# Patient Record
Sex: Female | Born: 1965 | Race: White | Hispanic: No | Marital: Married | Smoking: Never smoker
Health system: Southern US, Community
[De-identification: ages and names within clinical notes are randomized; demographics above are authoritative.]

## PROBLEM LIST (undated history)

## (undated) DIAGNOSIS — I499 Cardiac arrhythmia, unspecified: Secondary | ICD-10-CM

## (undated) DIAGNOSIS — I10 Essential (primary) hypertension: Secondary | ICD-10-CM

## (undated) DIAGNOSIS — D219 Benign neoplasm of connective and other soft tissue, unspecified: Secondary | ICD-10-CM

## (undated) DIAGNOSIS — E7800 Pure hypercholesterolemia, unspecified: Secondary | ICD-10-CM

## (undated) HISTORY — DX: Pure hypercholesterolemia, unspecified: E78.00

## (undated) HISTORY — PX: WISDOM TOOTH EXTRACTION: SHX21

---

## 2003-09-04 ENCOUNTER — Other Ambulatory Visit: Admission: RE | Admit: 2003-09-04 | Discharge: 2003-09-04 | Payer: Self-pay | Admitting: Obstetrics and Gynecology

## 2003-09-20 ENCOUNTER — Encounter: Admission: RE | Admit: 2003-09-20 | Discharge: 2003-09-20 | Payer: Self-pay | Admitting: Obstetrics and Gynecology

## 2004-11-20 ENCOUNTER — Encounter: Admission: RE | Admit: 2004-11-20 | Discharge: 2004-11-20 | Payer: Self-pay | Admitting: Obstetrics and Gynecology

## 2004-12-03 ENCOUNTER — Encounter: Admission: RE | Admit: 2004-12-03 | Discharge: 2004-12-03 | Payer: Self-pay | Admitting: Obstetrics and Gynecology

## 2005-03-22 ENCOUNTER — Other Ambulatory Visit: Admission: RE | Admit: 2005-03-22 | Discharge: 2005-03-22 | Payer: Self-pay | Admitting: Obstetrics & Gynecology

## 2005-12-15 ENCOUNTER — Encounter: Admission: RE | Admit: 2005-12-15 | Discharge: 2005-12-15 | Payer: Self-pay | Admitting: Obstetrics & Gynecology

## 2007-01-27 ENCOUNTER — Encounter: Admission: RE | Admit: 2007-01-27 | Discharge: 2007-01-27 | Payer: Self-pay | Admitting: Obstetrics & Gynecology

## 2008-08-21 ENCOUNTER — Encounter: Admission: RE | Admit: 2008-08-21 | Discharge: 2008-08-21 | Payer: Self-pay | Admitting: Obstetrics & Gynecology

## 2009-12-03 ENCOUNTER — Encounter: Admission: RE | Admit: 2009-12-03 | Discharge: 2009-12-03 | Payer: Self-pay | Admitting: Obstetrics & Gynecology

## 2011-07-06 ENCOUNTER — Other Ambulatory Visit: Payer: Self-pay | Admitting: Obstetrics & Gynecology

## 2011-07-06 DIAGNOSIS — Z1231 Encounter for screening mammogram for malignant neoplasm of breast: Secondary | ICD-10-CM

## 2011-07-13 ENCOUNTER — Ambulatory Visit
Admission: RE | Admit: 2011-07-13 | Discharge: 2011-07-13 | Disposition: A | Discharge status: Home / Self Care | Payer: BC Managed Care – PPO | Source: Ambulatory Visit | Attending: Obstetrics & Gynecology | Admitting: Obstetrics & Gynecology

## 2011-07-13 DIAGNOSIS — Z1231 Encounter for screening mammogram for malignant neoplasm of breast: Secondary | ICD-10-CM

## 2011-12-05 ENCOUNTER — Encounter (HOSPITAL_COMMUNITY): Payer: Self-pay | Admitting: *Deleted

## 2011-12-05 ENCOUNTER — Observation Stay (HOSPITAL_COMMUNITY)
Admission: AD | Admit: 2011-12-05 | Discharge: 2011-12-05 | Disposition: A | Discharge status: Home / Self Care | Payer: BC Managed Care – PPO | Source: Ambulatory Visit | Attending: Obstetrics | Admitting: Obstetrics

## 2011-12-05 DIAGNOSIS — D649 Anemia, unspecified: Principal | ICD-10-CM | POA: Insufficient documentation

## 2011-12-05 DIAGNOSIS — N920 Excessive and frequent menstruation with regular cycle: Secondary | ICD-10-CM | POA: Insufficient documentation

## 2011-12-05 DIAGNOSIS — D259 Leiomyoma of uterus, unspecified: Secondary | ICD-10-CM | POA: Insufficient documentation

## 2011-12-05 DIAGNOSIS — R0602 Shortness of breath: Secondary | ICD-10-CM | POA: Insufficient documentation

## 2011-12-05 HISTORY — DX: Benign neoplasm of connective and other soft tissue, unspecified: D21.9

## 2011-12-05 LAB — SAMPLE TO BLOOD BANK

## 2011-12-05 LAB — COMPREHENSIVE METABOLIC PANEL
ALT: 10 U/L (ref 0–35)
AST: 15 U/L (ref 0–37)
Albumin: 3.2 g/dL — ABNORMAL LOW (ref 3.5–5.2)
Alkaline Phosphatase: 60 U/L (ref 39–117)
BUN: 11 mg/dL (ref 6–23)
CO2: 21 mEq/L (ref 19–32)
Calcium: 8.6 mg/dL (ref 8.4–10.5)
Chloride: 101 mEq/L (ref 96–112)
Creatinine, Ser: 0.74 mg/dL (ref 0.50–1.10)
GFR calc Af Amer: >90 mL/min (ref >90)
GFR calc non Af Amer: >90 mL/min (ref >90)
Glucose, Bld: 98 mg/dL (ref 70–99)
Potassium: 4.4 mEq/L (ref 3.5–5.1)
Sodium: 135 mEq/L (ref 135–145)
Total Bilirubin: 0.1 mg/dL — ABNORMAL LOW (ref 0.3–1.2)
Total Protein: 6.1 g/dL (ref 6.0–8.3)

## 2011-12-05 LAB — CBC WITH DIFFERENTIAL/PLATELET
Basophils Absolute: 0 10*3/uL (ref 0.0–0.1)
Basophils Relative: 0 % (ref 0–1)
Eosinophils Absolute: 0.1 10*3/uL (ref 0.0–0.7)
Eosinophils Relative: 2 % (ref 0–5)
HCT: 22.4 % — ABNORMAL LOW (ref 36.0–46.0)
Hemoglobin: 6.5 g/dL — CL (ref 12.0–15.0)
Lymphocytes Relative: 15 % (ref 12–46)
Lymphs Abs: 1.1 10*3/uL (ref 0.7–4.0)
MCH: 21.6 pg — ABNORMAL LOW (ref 26.0–34.0)
MCHC: 29 g/dL — ABNORMAL LOW (ref 30.0–36.0)
MCV: 74.4 fL — ABNORMAL LOW (ref 78.0–100.0)
Monocytes Absolute: 0.6 10*3/uL (ref 0.1–1.0)
Monocytes Relative: 9 % (ref 3–12)
Neutro Abs: 5.2 10*3/uL (ref 1.7–7.7)
Neutrophils Relative %: 74 % (ref 43–77)
Platelets: 272 10*3/uL (ref 150–400)
RBC: 3.01 MIL/uL — ABNORMAL LOW (ref 3.87–5.11)
RDW: 17 % — ABNORMAL HIGH (ref 11.5–15.5)
WBC: 7 10*3/uL (ref 4.0–10.5)

## 2011-12-05 LAB — ABO/RH: ABO/RH(D): O POS

## 2011-12-05 LAB — PREPARE RBC (CROSSMATCH)

## 2011-12-05 MED ORDER — DIPHENHYDRAMINE HCL 25 MG PO CAPS
25.0000 mg | ORAL_CAPSULE | Freq: Once | ORAL | Status: AC
  Administered 2011-12-05: 25 mg via ORAL
  Filled 2011-12-05: qty 1

## 2011-12-05 MED ORDER — ACETAMINOPHEN 325 MG PO TABS
650.0000 mg | ORAL_TABLET | Freq: Once | ORAL | Status: AC
  Administered 2011-12-05: 650 mg via ORAL
  Filled 2011-12-05: qty 2

## 2011-12-05 MED ORDER — FUROSEMIDE 10 MG/ML IJ SOLN
20.0000 mg | Freq: Once | INTRAMUSCULAR | Status: AC
  Administered 2011-12-05: 20 mg via INTRAVENOUS
  Filled 2011-12-05: qty 2

## 2011-12-05 NOTE — Progress Notes (Signed)
CRITICAL VALUE ALERT  Critical value received:  HGB 6.5  Date of notification:  12/05/11  Time of notification: 1510  Critical value read back:yes  Nurse who received alert:  K.Devonna Oboyle,RN  MD notified (1st page):  Dr. Ernestina Penna on Unit  Time of first page:    MD notified (2nd page):  Time of second page:  Responding MD:   Time MD responded:

## 2011-12-05 NOTE — MAU Note (Signed)
Pt states hx of anemia r/t uterine fibroids. Has been bleeding x40 days. Is taking birth control tablets and was given depo provera to decrease bleeding and today bleeding is very light. Pt has had hx of labored breathing while walking up stairs, however today has had labored breathing with any activity. When sitting s/s go away.

## 2011-12-05 NOTE — H&P (Addendum)
Chief complaint: Shortness of breath  History of present illness: 46 year old nonpregnant patient with known fibroids, menorrhagia and anemia presents with shortness of breath this a.m. Patient was seen in the office this past week for the first time for complaints of menorrhagia. Patient was started on Depo-Provera and oral contraceptive pills and notes through the week her bleeding has slowed and over the past 24 hours she notes only scant vaginal spotting. No dysmenorrhea. Patient states no chest pain, no fevers. At rest the patient has no shortness of breath and no dizziness, however when active patient notes some mild dizziness and shortness of breath. Both of these resolved with resting. Patient was concerned about her hemoglobin level and presented for evaluation. Patient notes no neck pain, no arm pain no chest pain, no radiating pain of any kind.  Past medical history: Patient denies hypertension, denies hypercholesterolemia, denies any cardiovascular or pulmonary medical problems.  Medications: Depo-Provera, oral contraceptives, iron No known drug allergies Social history: Married, husband with vasectomy  Physical exam:  Filed Vitals:   12/05/11 1421  BP: 150/74  Pulse: 100  Temp: 97.9 F (36.6 C)  TempSrc: Oral  Resp: 20  Height: 5\' 7"  (1.702 m)  Weight: 103.477 kg (228 lb 2 oz)  SpO2: 100%    General: Well-appearing, in no distress, obese Cardiovascular: Regular rate and rhythm, no murmur rub or gallop Pulmonary: Clear to auscultation bilaterally, no wheezes Back: No CVA tenderness Abdomen: Obese, pelvic mass consistent with fibroid, nontender, no rebound, no guarding, no distention GU: Deferred Lower extremities: Nontender, no edema  CBC    Component Value Date/Time   WBC 7.0 12/05/2011 1448   RBC 3.01* 12/05/2011 1448   HGB 6.5* 12/05/2011 1448   HCT 22.4* 12/05/2011 1448   PLT 272 12/05/2011 1448   MCV 74.4* 12/05/2011 1448   MCH 21.6* 12/05/2011 1448   MCHC 29.0*  12/05/2011 1448   RDW 17.0* 12/05/2011 1448   LYMPHSABS PENDING 12/05/2011 1448   MONOABS PENDING 12/05/2011 1448   EOSABS PENDING 12/05/2011 1448   BASOSABS PENDING 12/05/2011 1448     Assessment and plan: 46 year old G2 P0 with complaint of shortness of breath only on exertion, most likely secondary to symptomatic anemia  Anemia. No current bleeding, chronic anemia from vaginal bleeding. Bleeding is controlled hormonally at this time and patient is outpatient followup for this issue. Given her symptoms will plan transfusion 2 units packed red blood cells. Transfusion reaction, HIV and hepatitis C risks discussed with patient. Also discussed pulmonary edema risk and will plan IV Lasix between units. Patient in agreement.   Stacie Nunez A. 12/05/2011 3:30 PM   Nl EKG  Pulmonary embolus remains in differential diagnosis. Patient is obese and on oral contraceptives. Patient with normal vitals, normal O2 sat, no lower extremity edema no wheezing. Will continue to consider this diagnosis however clinically anemia is the most likely cause for her symptoms.

## 2011-12-06 NOTE — Discharge Summary (Signed)
  Physician Discharge Summary  Patient ID: Stacie Nunez MRN: [id number] DOB/AGE: [birth year] 46 y.o.  Admit date: 12/05/2011 Discharge date: 12/06/2011  Admission Diagnoses: LABORED BREATHING, ANEMIC  Discharge Diagnoses: LABORED BREATHING, ANEMIC        Active Problems:  * No active hospital problems. *    Discharged Condition: good  Hospital Course: Improved post blood transfusion  Consults: None  Treatments: Packed red blood cell transfusion  Disposition: 01-Home or Self Care     Medication List     As of 12/06/2011  1:00 AM    ASK your doctor about these medications         B-12 PO   Take 1 tablet by mouth daily. OTC b-12 supplement, pt does not know strength      calcium-vitamin D 500-200 MG-UNIT per tablet   Commonly known as: OSCAL WITH D   Take 1 tablet by mouth daily.      ferrous sulfate 325 (65 FE) MG tablet   Take 325 mg by mouth daily.      ibuprofen 200 MG tablet   Commonly known as: ADVIL,MOTRIN   Take 600 mg by mouth every 6 (six) hours as needed. pain      medroxyPROGESTERone 150 MG/ML injection   Commonly known as: DEPO-PROVERA   Inject 150 mg into the muscle every 3 (three) months.      norethindrone-ethinyl estradiol-iron 1.5-30 MG-MCG tablet   Commonly known as: MICROGESTIN FE,GILDESS FE,LOESTRIN FE   Take 1 tablet by mouth daily.         SignedGenia Del, MD 12/06/2011, 1:00 AM

## 2011-12-07 LAB — TYPE AND SCREEN
ABO/RH(D): O POS
Antibody Screen: NEGATIVE
Unit division: 0
Unit division: 0

## 2011-12-15 NOTE — Progress Notes (Signed)
Post discharge review completed for dates of service of 12/05/11.

## 2012-01-25 ENCOUNTER — Ambulatory Visit (INDEPENDENT_AMBULATORY_CARE_PROVIDER_SITE_OTHER): Payer: BC Managed Care – PPO | Admitting: Family Medicine

## 2012-01-25 VITALS — BP 163/100 | HR 99 | Temp 98.2°F | Resp 16 | Ht 67.0 in | Wt 226.0 lb

## 2012-01-25 DIAGNOSIS — IMO0001 Reserved for inherently not codable concepts without codable children: Secondary | ICD-10-CM

## 2012-01-25 DIAGNOSIS — R030 Elevated blood-pressure reading, without diagnosis of hypertension: Secondary | ICD-10-CM

## 2012-01-25 DIAGNOSIS — D649 Anemia, unspecified: Secondary | ICD-10-CM

## 2012-01-25 LAB — BASIC METABOLIC PANEL
Calcium: 9.2 mg/dL (ref 8.4–10.5)
Potassium: 4.2 mEq/L (ref 3.5–5.3)
Sodium: 139 mEq/L (ref 135–145)

## 2012-01-25 LAB — BASIC METABOLIC PANEL WITH GFR
BUN: 6 mg/dL (ref 6–23)
CO2: 21 meq/L (ref 19–32)
Chloride: 107 meq/L (ref 96–112)
Creat: 0.81 mg/dL (ref 0.50–1.10)
Glucose, Bld: 87 mg/dL (ref 70–99)

## 2012-01-25 MED ORDER — HYDROCHLOROTHIAZIDE 25 MG PO TABS: 25.0000 mg | ORAL_TABLET | Freq: Every day | ORAL | Status: DC

## 2012-01-25 NOTE — Progress Notes (Signed)
[facility]:  Office Visit  Chief Complaint:  Chief Complaint  Patient presents with  . Hypertension    HPI: Stacie Nunez is a 46 y.o. female who complains of HTN. BP at Ob/Gyn was high in the 150-170s over 90s-100. She does not have CP, palpitations, nausea, vomiting, HA, vision changes. No h/o  MI or CVA sxs. Was put on low dose OCP for fibroids, then started bleeding and has been bledding for 92 days. She then got depo and then also OCP and Lupron injections. Now only on Lupron. Labs done at [facility] : CBC yesterday at 12.3. She has not taken any new meds excep what has been rx by Ob, caffeine intake is none, alcohol intake is rare. No prior h.o HTN until on meds for fibroids. Goal is to stop bleeding and possibly do hysterectomy.      Past Medical History  Diagnosis Date  . Fibroids    Past Surgical History  Procedure Date  . Wisdom tooth extraction    History   Social History  . Marital Status: Married    Spouse Name: N/A    Number of Children: N/A  . Years of Education: N/A   Social History Main Topics  . Smoking status: Never Smoker   . Smokeless tobacco: None  . Alcohol Use: No  . Drug Use: No  . Sexually Active: Yes   Other Topics Concern  . None   Social History Narrative  . None   Family History  Problem Relation Age of Onset  . Hypertension Mother   . Hypertension Father   . Cancer Maternal Grandmother   . Diabetes Paternal Grandmother    No Known Allergies Prior to Admission medications   Medication Sig Start Date End Date Taking? Authorizing Provider  calcium-vitamin D (OSCAL WITH D) 500-200 MG-UNIT per tablet Take 1 tablet by mouth daily.   Yes Historical Provider, MD  Cyanocobalamin (B-12 PO) Take 1 tablet by mouth daily. OTC b-12 supplement, pt does not know strength   Yes Historical Provider, MD  ferrous sulfate 325 (65 FE) MG tablet Take 325 mg by mouth daily.   Yes Historical Provider, MD  ibuprofen  (ADVIL,MOTRIN) 200 MG tablet Take 600 mg by mouth every 6 (six) hours as needed. pain   Yes Historical Provider, MD  medroxyPROGESTERone (DEPO-PROVERA) 150 MG/ML injection Inject 150 mg into the muscle every 3 (three) months.   Yes Historical Provider, MD  norethindrone-ethinyl estradiol-iron (MICROGESTIN FE,GILDESS FE,LOESTRIN FE) 1.5-30 MG-MCG tablet Take 1 tablet by mouth daily.   Yes Historical Provider, MD     ROS: The patient denies fevers, chills, night sweats, unintentional weight loss, chest pain, palpitations, wheezing, dyspnea on exertion, nausea, vomiting, abdominal pain, dysuria, hematuria, melena, numbness, weakness, or tingling.   All other systems have been reviewed and were otherwise negative with the exception of those mentioned in the HPI and as above.    PHYSICAL EXAM: Filed Vitals:   01/25/12 0939  BP: 163/100  Pulse: 99  Temp: 98.2 F (36.8 C)  Resp: 16   Filed Vitals:   01/25/12 0939  Height: 5\' 7"  (1.702 m)  Weight: 226 lb (102.513 kg)   Body mass index is 35.40 kg/(m^2).  General: Alert, no acute distress HEENT:  Normocephalic, atraumatic, oropharynx patent. EOMI, PERRLA, fundoscopic exam nl. Cardiovascular:  Regular rate and rhythm, no rubs murmurs or gallops.  No Carotid bruits, radial pulse intact. No pedal edema.  Respiratory: Clear to auscultation bilaterally.  No  wheezes, rales, or rhonchi.  No cyanosis, no use of accessory musculature GI: No organomegaly, abdomen is soft and non-tender, positive bowel sounds.  No masses. Skin: No rashes. Neurologic: Facial musculature symmetric. Psychiatric: Patient is appropriate throughout our interaction. Lymphatic: No cervical lymphadenopathy Musculoskeletal: Gait intact.   LABS: Results for orders placed during the hospital encounter of 12/05/11  CBC WITH DIFFERENTIAL      Component Value Range   WBC 7.0  4.0 - 10.5 K/uL   RBC 3.01 (*) 3.87 - 5.11 MIL/uL   Hemoglobin 6.5 (*) 12.0 - 15.0 g/dL   HCT 16.1  (*) 09.6 - 46.0 %   MCV 74.4 (*) 78.0 - 100.0 fL   MCH 21.6 (*) 26.0 - 34.0 pg   MCHC 29.0 (*) 30.0 - 36.0 g/dL   RDW 04.5 (*) 40.9 - 81.1 %   Platelets 272  150 - 400 K/uL   Neutrophils Relative 74  43 - 77 %   Lymphocytes Relative 15  12 - 46 %   Monocytes Relative 9  3 - 12 %   Eosinophils Relative 2  0 - 5 %   Basophils Relative 0  0 - 1 %   Neutro Abs 5.2  1.7 - 7.7 K/uL   Lymphs Abs 1.1  0.7 - 4.0 K/uL   Monocytes Absolute 0.6  0.1 - 1.0 K/uL   Eosinophils Absolute 0.1  0.0 - 0.7 K/uL   Basophils Absolute 0.0  0.0 - 0.1 K/uL   RBC Morphology POLYCHROMASIA PRESENT    COMPREHENSIVE METABOLIC PANEL      Component Value Range   Sodium 135  135 - 145 mEq/L   Potassium 4.4  3.5 - 5.1 mEq/L   Chloride 101  96 - 112 mEq/L   CO2 21  19 - 32 mEq/L   Glucose, Bld 98  70 - 99 mg/dL   BUN 11  6 - 23 mg/dL   Creatinine, Ser 9.14  0.50 - 1.10 mg/dL   Calcium 8.6  8.4 - 78.2 mg/dL   Total Protein 6.1  6.0 - 8.3 g/dL   Albumin 3.2 (*) 3.5 - 5.2 g/dL   AST 15  0 - 37 U/L   ALT 10  0 - 35 U/L   Alkaline Phosphatase 60  39 - 117 U/L   Total Bilirubin 0.1 (*) 0.3 - 1.2 mg/dL   GFR calc non Af Amer >90  >90 mL/min   GFR calc Af Amer >90  >90 mL/min  PREPARE RBC (CROSSMATCH)      Component Value Range   Order Confirmation ORDER PROCESSED BY BLOOD BANK    TYPE AND SCREEN      Component Value Range   ABO/RH(D) O POS     Antibody Screen NEG     Sample Expiration 12/08/2011     Unit Number [id number]     Blood Component Type RED CELLS,LR     Unit division 00     Status of Unit ISSUED,FINAL     Transfusion Status OK TO TRANSFUSE     Crossmatch Result Compatible     Unit Number [id number]     Blood Component Type RED CELLS,LR     Unit division 00     Status of Unit ISSUED,FINAL     Transfusion Status OK TO TRANSFUSE     Crossmatch Result Compatible    SAMPLE TO BLOOD BANK      Component Value Range   Blood Bank Specimen SAMPLE AVAILABLE FOR TESTING  Sample Expiration  12/08/2011    ABO/RH      Component Value Range   ABO/RH(D) O POS       EKG/XRAY:   Primary read interpreted by Dr. Conley Rolls at [facility].   ASSESSMENT/PLAN: Encounter Diagnoses  Name Primary?  . Elevated blood pressure Yes  . Anemia    I will not dx with HTN since this may just be a SE of all the recent hormone medications she has been on for her fibroids and bleeding. We will just try to manage BP until all that is stabilized and she can get a hysterectomy if that is the final outcome. Went through BP med options with her.  Will try HCTZ 25mg  daily BP goal is < 140/90, HR < 100 Check BP BID after meds and then before bedtime, if not at goal then call in 3 days. Recheck BP logs  in 1 week in office.    LE, THAO PHUONG, DO 01/25/2012 10:16 AM

## 2012-01-28 ENCOUNTER — Telehealth: Payer: Self-pay | Admitting: Family Medicine

## 2012-01-28 NOTE — Telephone Encounter (Signed)
Spoke to patient about labs, she has had some tingling in her legs but not consistent. BP is lower but diastolic is still higher than 90. Wants to try another couple of days on HCTZ and get BP logs to me before adding another BP med. Advise to eat a banana a day

## 2012-02-02 DIAGNOSIS — I499 Cardiac arrhythmia, unspecified: Secondary | ICD-10-CM

## 2012-02-02 DIAGNOSIS — I10 Essential (primary) hypertension: Secondary | ICD-10-CM

## 2012-02-02 HISTORY — DX: Cardiac arrhythmia, unspecified: I49.9

## 2012-02-02 HISTORY — DX: Essential (primary) hypertension: I10

## 2012-02-08 ENCOUNTER — Other Ambulatory Visit: Payer: Self-pay | Admitting: Family Medicine

## 2012-02-08 DIAGNOSIS — IMO0001 Reserved for inherently not codable concepts without codable children: Secondary | ICD-10-CM

## 2012-02-08 MED ORDER — HYDROCHLOROTHIAZIDE 25 MG PO TABS: 25.0000 mg | ORAL_TABLET | Freq: Every day | ORAL | Status: DC

## 2012-02-15 LAB — BASIC METABOLIC PANEL
Glucose: 93 mg/dL
Potassium: 4.7 mmol/L (ref 3.4–5.3)

## 2012-02-15 LAB — CBC AND DIFFERENTIAL
HCT: 28 % — AB (ref 36–46)
Hemoglobin: 9.3 g/dL — AB (ref 12.0–16.0)
Platelets: 466 10*3/uL — AB (ref 150–399)
WBC: 5.3 10^3/mL

## 2012-02-16 ENCOUNTER — Ambulatory Visit (INDEPENDENT_AMBULATORY_CARE_PROVIDER_SITE_OTHER): Payer: BC Managed Care – PPO | Admitting: Family Medicine

## 2012-02-16 VITALS — BP 130/84 | HR 105 | Temp 98.1°F | Resp 16 | Ht 67.0 in | Wt 215.8 lb

## 2012-02-16 DIAGNOSIS — R000 Tachycardia, unspecified: Secondary | ICD-10-CM

## 2012-02-16 DIAGNOSIS — I10 Essential (primary) hypertension: Secondary | ICD-10-CM

## 2012-02-16 LAB — HEPATIC FUNCTION PANEL
ALT: 18 U/L (ref 7–35)
AST: 17 U/L (ref 13–35)
Bilirubin, Total: 0.1 mg/dL

## 2012-02-16 NOTE — Progress Notes (Signed)
[facility]:  Office Visit  Chief Complaint:  Chief Complaint  Patient presents with  . Arm Pain    left  . Follow-up    blood work- hemoglobin  . Leg Pain    left    HPI: Stacie Nunez is a 47 y.o. female who complains of  Elevated BP. She is here for recheck. Since being on HCTZ her BP has been in range for the most part < 140/90   She has been on HCTZ, she will have twinges in her body. Heart races on occasion. Left sided arm pain , twinges x 1 day, lasting several hours. Recently Had some active yoga practice this weekend. Just on Lupron since Dec 6 with the hopes that it will shrink her fibroids. Prior to this she was on OCP and Angestin and was bleeding heavily. Tomorrow has [place] to make sure she has no polyps. Also on Percocet for fibroid pain. The goal is that if she does not have polypsm her bleeding is unter control then her OB/Gyn will put in mirena and then go into menopause to control fibroid sxs. IF she has polyps then she is considering surgery. Again she is here for BP check only from out office, BP has been elevated since she was put on hormone meds.   Past Medical History  Diagnosis Date  . Fibroids    Past Surgical History  Procedure Date  . Wisdom tooth extraction    History   Social History  . Marital Status: Married    Spouse Name: N/A    Number of Children: N/A  . Years of Education: N/A   Social History Main Topics  . Smoking status: Never Smoker   . Smokeless tobacco: None  . Alcohol Use: No  . Drug Use: No  . Sexually Active: Yes   Other Topics Concern  . None   Social History Narrative  . None   Family History  Problem Relation Age of Onset  . Hypertension Mother   . Hypertension Father   . Cancer Maternal Grandmother   . Diabetes Paternal Grandmother    No Known Allergies Prior to Admission medications   Medication Sig Start Date End Date Taking? Authorizing Provider  ferrous sulfate 325 (65 FE) MG tablet Take  325 mg by mouth daily.   Yes Historical Provider, MD  hydrochlorothiazide (HYDRODIURIL) 25 MG tablet Take 1 tablet (25 mg total) by mouth daily. 02/08/12  Yes Thao P Le, DO  ibuprofen (ADVIL,MOTRIN) 200 MG tablet Take 600 mg by mouth every 6 (six) hours as needed. pain   Yes Historical Provider, MD  medroxyPROGESTERone (DEPO-PROVERA) 150 MG/ML injection Inject 150 mg into the muscle every 3 (three) months.   Yes Historical Provider, MD  calcium-vitamin D (OSCAL WITH D) 500-200 MG-UNIT per tablet Take 1 tablet by mouth daily.    Historical Provider, MD  Cyanocobalamin (B-12 PO) Take 1 tablet by mouth daily. OTC b-12 supplement, pt does not know strength    Historical Provider, MD  norethindrone-ethinyl estradiol-iron (MICROGESTIN FE,GILDESS FE,LOESTRIN FE) 1.5-30 MG-MCG tablet Take 1 tablet by mouth daily.    Historical Provider, MD     ROS: The patient denies fevers, chills, night sweats, unintentional weight loss,wheezing, dyspnea on exertion, nausea, vomiting, abdominal pain, dysuria, hematuria, melena, numbness, weakness, or tingling.  All other systems have been reviewed and were otherwise negative with the exception of those mentioned in the HPI and as above.    PHYSICAL EXAM: Filed Vitals:  02/16/12 1253  BP: 130/84  Pulse: 105  Temp: 98.1 F (36.7 C)  Resp: 16   Filed Vitals:   02/16/12 1253  Height: 5\' 7"  (1.702 m)  Weight: 215 lb 12.8 oz (97.886 kg)   Body mass index is 33.80 kg/(m^2).  General: Alert, no acute distress HEENT:  Normocephalic, atraumatic, oropharynx patent.  Cardiovascular:  Regular rhythm, no rubs murmurs or gallops.  No Carotid bruits, radial pulse intact. No pedal edema.  Respiratory: Clear to auscultation bilaterally.  No wheezes, rales, or rhonchi.  No cyanosis, no use of accessory musculature GI: No organomegaly, abdomen is soft and non-tender, positive bowel sounds.  No masses. Skin: No rashes. Neurologic: Facial musculature symmetric. Psychiatric:  Patient is appropriate throughout our interaction. Lymphatic: No cervical lymphadenopathy Musculoskeletal: Gait intact.   LABS: Results for orders placed in visit on 01/25/12  BASIC METABOLIC PANEL      Component Value Range   Sodium 139  135 - 145 mEq/L   Potassium 4.2  3.5 - 5.3 mEq/L   Chloride 107  96 - 112 mEq/L   CO2 21  19 - 32 mEq/L   Glucose, Bld 87  70 - 99 mg/dL   BUN 6  6 - 23 mg/dL   Creat 0.96  0.45 - 4.09 mg/dL   Calcium 9.2  8.4 - 81.1 mg/dL     EKG/XRAY:   Primary read interpreted by Dr. Conley Rolls at [facility]. EKG SR 92 BPM no ST-T wave changes   ASSESSMENT/PLAN: Encounter Diagnoses  Name Primary?  . Tachycardia Yes  . HTN (hypertension)    F/u in 3 months or prn Repeat BMP on 02/15/11 was normal: Potassium 4.7, Sodium 139, Chloride 92, Creatinine 0.82.  She will continue her BP and pulse. We discussed other options for BP control and pulse control but she would prefer to stay with this medication. She does not have any other sxs suggestive of CAD/MI/ischemia, EKG was NSR. She has had significant bleeding due to fibroids and also lupron. This is her 115 day of vaginal bleeding. She went from 11 to 9 in her Hgb within 1-2 weeks.    LE, THAO PHUONG, DO 02/16/2012 1:52 PM

## 2012-04-28 ENCOUNTER — Encounter: Payer: Self-pay | Admitting: *Deleted

## 2012-05-16 ENCOUNTER — Encounter: Payer: Self-pay | Admitting: Family Medicine

## 2012-05-16 DIAGNOSIS — I10 Essential (primary) hypertension: Secondary | ICD-10-CM

## 2012-06-12 ENCOUNTER — Other Ambulatory Visit: Payer: Self-pay | Admitting: Family Medicine

## 2012-06-20 ENCOUNTER — Other Ambulatory Visit: Payer: Self-pay

## 2012-06-20 DIAGNOSIS — Z1231 Encounter for screening mammogram for malignant neoplasm of breast: Secondary | ICD-10-CM

## 2012-07-18 ENCOUNTER — Ambulatory Visit
Admission: RE | Admit: 2012-07-18 | Discharge: 2012-07-18 | Disposition: A | Discharge status: Home / Self Care | Payer: BC Managed Care – PPO | Source: Ambulatory Visit

## 2012-07-18 DIAGNOSIS — Z1231 Encounter for screening mammogram for malignant neoplasm of breast: Secondary | ICD-10-CM

## 2013-05-15 ENCOUNTER — Other Ambulatory Visit: Payer: Self-pay | Admitting: Obstetrics & Gynecology

## 2013-05-16 ENCOUNTER — Encounter (HOSPITAL_COMMUNITY): Payer: Self-pay | Admitting: *Deleted

## 2013-05-17 ENCOUNTER — Encounter (HOSPITAL_COMMUNITY): Payer: Self-pay | Admitting: Pharmacist

## 2013-05-22 ENCOUNTER — Encounter (HOSPITAL_COMMUNITY): Payer: BC Managed Care – PPO | Admitting: Certified Registered Nurse Anesthetist

## 2013-05-22 ENCOUNTER — Encounter (HOSPITAL_COMMUNITY)
Admission: RE | Disposition: A | Discharge status: Home / Self Care | Payer: Self-pay | Source: Ambulatory Visit | Attending: Obstetrics & Gynecology

## 2013-05-22 ENCOUNTER — Ambulatory Visit (HOSPITAL_COMMUNITY): Payer: BC Managed Care – PPO | Admitting: Certified Registered Nurse Anesthetist

## 2013-05-22 ENCOUNTER — Ambulatory Visit (HOSPITAL_COMMUNITY)
Admission: RE | Admit: 2013-05-22 | Discharge: 2013-05-22 | Disposition: A | Discharge status: Home / Self Care | Payer: BC Managed Care – PPO | Source: Ambulatory Visit | Attending: Obstetrics & Gynecology | Admitting: Obstetrics & Gynecology

## 2013-05-22 ENCOUNTER — Encounter (HOSPITAL_COMMUNITY): Payer: Self-pay | Admitting: *Deleted

## 2013-05-22 DIAGNOSIS — D251 Intramural leiomyoma of uterus: Secondary | ICD-10-CM | POA: Insufficient documentation

## 2013-05-22 DIAGNOSIS — Z8249 Family history of ischemic heart disease and other diseases of the circulatory system: Secondary | ICD-10-CM | POA: Insufficient documentation

## 2013-05-22 DIAGNOSIS — D1739 Benign lipomatous neoplasm of skin and subcutaneous tissue of other sites: Secondary | ICD-10-CM | POA: Insufficient documentation

## 2013-05-22 DIAGNOSIS — N921 Excessive and frequent menstruation with irregular cycle: Secondary | ICD-10-CM | POA: Insufficient documentation

## 2013-05-22 DIAGNOSIS — I10 Essential (primary) hypertension: Secondary | ICD-10-CM | POA: Insufficient documentation

## 2013-05-22 DIAGNOSIS — I499 Cardiac arrhythmia, unspecified: Secondary | ICD-10-CM | POA: Insufficient documentation

## 2013-05-22 HISTORY — PX: MYOMECTOMY: SHX85

## 2013-05-22 HISTORY — PX: LIPOMA EXCISION: SHX5283

## 2013-05-22 HISTORY — DX: Essential (primary) hypertension: I10

## 2013-05-22 HISTORY — DX: Cardiac arrhythmia, unspecified: I49.9

## 2013-05-22 HISTORY — PX: DILATION AND CURETTAGE OF UTERUS: SHX78

## 2013-05-22 LAB — CBC
HEMATOCRIT: 37.2 % (ref 36.0–46.0)
Hemoglobin: 11.7 g/dL — ABNORMAL LOW (ref 12.0–15.0)
MCH: 28.7 pg (ref 26.0–34.0)
MCHC: 31.5 g/dL (ref 30.0–36.0)
MCV: 91.2 fL (ref 78.0–100.0)
Platelets: 303 10*3/uL (ref 150–400)
RBC: 4.08 MIL/uL (ref 3.87–5.11)
RDW: 14.2 % (ref 11.5–15.5)
WBC: 5.2 10*3/uL (ref 4.0–10.5)

## 2013-05-22 LAB — PREGNANCY, URINE: Preg Test, Ur: NEGATIVE

## 2013-05-22 LAB — TYPE AND SCREEN
ABO/RH(D): O POS
ANTIBODY SCREEN: NEGATIVE

## 2013-05-22 SURGERY — MYOMECTOMY, UTERUS, VAGINAL APPROACH
Anesthesia: General | Site: Vagina | Laterality: Right

## 2013-05-22 MED ORDER — LIDOCAINE HCL (CARDIAC) 20 MG/ML IV SOLN
INTRAVENOUS | Status: DC | PRN
  Administered 2013-05-22: 60 mg via INTRAVENOUS

## 2013-05-22 MED ORDER — KETOROLAC TROMETHAMINE 30 MG/ML IJ SOLN
INTRAMUSCULAR | Status: AC
  Filled 2013-05-22: qty 1

## 2013-05-22 MED ORDER — CHLOROPROCAINE HCL 1 % IJ SOLN
INTRAMUSCULAR | Status: AC
  Filled 2013-05-22: qty 30

## 2013-05-22 MED ORDER — LIDOCAINE HCL (CARDIAC) 20 MG/ML IV SOLN
INTRAVENOUS | Status: AC
  Filled 2013-05-22: qty 5

## 2013-05-22 MED ORDER — KETOROLAC TROMETHAMINE 30 MG/ML IJ SOLN: 15.0000 mg | Freq: Once | INTRAMUSCULAR | Status: DC | PRN

## 2013-05-22 MED ORDER — SCOPOLAMINE 1 MG/3DAYS TD PT72
1.0000 | MEDICATED_PATCH | TRANSDERMAL | Status: DC
  Administered 2013-05-22: 1.5 mg via TRANSDERMAL
  Administered 2013-05-22: 1 via TRANSDERMAL

## 2013-05-22 MED ORDER — ONDANSETRON HCL 4 MG/2ML IJ SOLN
INTRAMUSCULAR | Status: AC
  Filled 2013-05-22: qty 2

## 2013-05-22 MED ORDER — MIDAZOLAM HCL 2 MG/2ML IJ SOLN
INTRAMUSCULAR | Status: AC
  Filled 2013-05-22: qty 2

## 2013-05-22 MED ORDER — MIDAZOLAM HCL 2 MG/2ML IJ SOLN
INTRAMUSCULAR | Status: DC | PRN
  Administered 2013-05-22: 2 mg via INTRAVENOUS

## 2013-05-22 MED ORDER — GLYCOPYRROLATE 0.2 MG/ML IJ SOLN
INTRAMUSCULAR | Status: AC
  Filled 2013-05-22: qty 1

## 2013-05-22 MED ORDER — DEXAMETHASONE SODIUM PHOSPHATE 10 MG/ML IJ SOLN
INTRAMUSCULAR | Status: AC
  Filled 2013-05-22: qty 1

## 2013-05-22 MED ORDER — CHLOROPROCAINE HCL 1 % IJ SOLN
INTRAMUSCULAR | Status: DC | PRN
  Administered 2013-05-22: 20 mL

## 2013-05-22 MED ORDER — CEFAZOLIN SODIUM-DEXTROSE 2-3 GM-% IV SOLR
INTRAVENOUS | Status: AC
  Filled 2013-05-22: qty 50

## 2013-05-22 MED ORDER — FENTANYL CITRATE 0.05 MG/ML IJ SOLN
INTRAMUSCULAR | Status: AC
  Filled 2013-05-22: qty 2

## 2013-05-22 MED ORDER — LACTATED RINGERS IV SOLN
INTRAVENOUS | Status: DC
  Administered 2013-05-22 (×2): via INTRAVENOUS

## 2013-05-22 MED ORDER — FENTANYL CITRATE 0.05 MG/ML IJ SOLN
25.0000 ug | INTRAMUSCULAR | Status: DC | PRN
  Administered 2013-05-22: 50 ug via INTRAVENOUS

## 2013-05-22 MED ORDER — ONDANSETRON HCL 4 MG/2ML IJ SOLN: 4.0000 mg | Freq: Once | INTRAMUSCULAR | Status: DC | PRN

## 2013-05-22 MED ORDER — PROPOFOL 10 MG/ML IV BOLUS
INTRAVENOUS | Status: DC | PRN
  Administered 2013-05-22: 200 mg via INTRAVENOUS

## 2013-05-22 MED ORDER — GLYCOPYRROLATE 0.2 MG/ML IJ SOLN
INTRAMUSCULAR | Status: DC | PRN
  Administered 2013-05-22: 0.2 mg via INTRAVENOUS

## 2013-05-22 MED ORDER — HYDROCODONE-IBUPROFEN 5-200 MG PO TABS: 1.0000 | ORAL_TABLET | Freq: Three times a day (TID) | ORAL | Status: DC | PRN

## 2013-05-22 MED ORDER — CEFAZOLIN SODIUM-DEXTROSE 2-3 GM-% IV SOLR
2.0000 g | Freq: Once | INTRAVENOUS | Status: AC
Start: 2013-05-22 — End: 2013-05-22
  Administered 2013-05-22: 2 g via INTRAVENOUS

## 2013-05-22 MED ORDER — ONDANSETRON HCL 4 MG/2ML IJ SOLN
INTRAMUSCULAR | Status: DC | PRN
Start: 2013-05-22 — End: 2013-05-22
  Administered 2013-05-22: 4 mg via INTRAVENOUS

## 2013-05-22 MED ORDER — DEXAMETHASONE SODIUM PHOSPHATE 10 MG/ML IJ SOLN
INTRAMUSCULAR | Status: DC | PRN
  Administered 2013-05-22: 10 mg via INTRAVENOUS

## 2013-05-22 MED ORDER — MEPERIDINE HCL 25 MG/ML IJ SOLN: 6.2500 mg | INTRAMUSCULAR | Status: DC | PRN

## 2013-05-22 MED ORDER — KETOROLAC TROMETHAMINE 30 MG/ML IJ SOLN
INTRAMUSCULAR | Status: DC | PRN
  Administered 2013-05-22: 30 mg via INTRAVENOUS

## 2013-05-22 MED ORDER — SCOPOLAMINE 1 MG/3DAYS TD PT72
MEDICATED_PATCH | TRANSDERMAL | Status: AC
  Administered 2013-05-22: 1.5 mg via TRANSDERMAL
  Filled 2013-05-22: qty 1

## 2013-05-22 MED ORDER — LIDOCAINE HCL 1 % IJ SOLN
INTRAMUSCULAR | Status: AC
  Filled 2013-05-22: qty 20

## 2013-05-22 MED ORDER — FENTANYL CITRATE 0.05 MG/ML IJ SOLN
INTRAMUSCULAR | Status: DC | PRN
  Administered 2013-05-22 (×2): 25 ug via INTRAVENOUS
  Administered 2013-05-22: 50 ug via INTRAVENOUS
  Administered 2013-05-22 (×2): 25 ug via INTRAVENOUS
  Administered 2013-05-22: 50 ug via INTRAVENOUS

## 2013-05-22 SURGICAL SUPPLY — 32 items
ABLATOR ENDOMETRIAL BIPOLAR (ABLATOR) IMPLANT
BLADE SURG 10 STRL SS (BLADE) ×3 IMPLANT
BLADE SURG 15 STRL LF C SS BP (BLADE) ×2 IMPLANT
BLADE SURG 15 STRL SS (BLADE) ×1
CANISTER SUCT 3000ML (MISCELLANEOUS) ×3 IMPLANT
CATH ROBINSON RED A/P 16FR (CATHETERS) ×3 IMPLANT
CLOTH BEACON ORANGE TIMEOUT ST (SAFETY) ×3 IMPLANT
CONTAINER PREFILL 10% NBF 60ML (FORM) ×6 IMPLANT
DERMABOND ADVANCED (GAUZE/BANDAGES/DRESSINGS) ×1
DERMABOND ADVANCED .7 DNX12 (GAUZE/BANDAGES/DRESSINGS) ×2 IMPLANT
DRAPE HYSTEROSCOPY (DRAPE) ×3 IMPLANT
DRSG COVADERM 4X10 (GAUZE/BANDAGES/DRESSINGS) ×3 IMPLANT
DRSG TELFA 3X8 NADH (GAUZE/BANDAGES/DRESSINGS) ×3 IMPLANT
ELECT REM PT RETURN 9FT ADLT (ELECTROSURGICAL) ×3
ELECTRODE REM PT RTRN 9FT ADLT (ELECTROSURGICAL) ×2 IMPLANT
GLOVE BIO SURGEON STRL SZ 6.5 (GLOVE) ×3 IMPLANT
GLOVE BIO SURGEON STRL SZ7 (GLOVE) ×6 IMPLANT
GLOVE BIOGEL PI IND STRL 7.0 (GLOVE) ×2 IMPLANT
GLOVE BIOGEL PI INDICATOR 7.0 (GLOVE) ×1
GOWN STRL REUS W/TWL LRG LVL3 (GOWN DISPOSABLE) ×6 IMPLANT
LOOP ANGLED CUTTING 22FR (CUTTING LOOP) IMPLANT
NEEDLE HYPO 25X1 1.5 SAFETY (NEEDLE) ×3 IMPLANT
NEEDLE SPNL 22GX3.5 QUINCKE BK (NEEDLE) ×3 IMPLANT
PACK VAGINAL MINOR WOMEN LF (CUSTOM PROCEDURE TRAY) ×3 IMPLANT
PAD OB MATERNITY 4.3X12.25 (PERSONAL CARE ITEMS) ×3 IMPLANT
PAD PREP 24X48 CUFFED NSTRL (MISCELLANEOUS) ×3 IMPLANT
SET TUBING HYSTEROSCOPY 2 NDL (TUBING) ×3 IMPLANT
SUT VICRYL RAPIDE 3 0 (SUTURE) ×6 IMPLANT
SYR CONTROL 10ML LL (SYRINGE) ×3 IMPLANT
TOWEL OR 17X24 6PK STRL BLUE (TOWEL DISPOSABLE) ×6 IMPLANT
TUBE HYSTEROSCOPY W Y-CONNECT (TUBING) ×6 IMPLANT
WATER STERILE IRR 1000ML POUR (IV SOLUTION) ×3 IMPLANT

## 2013-05-22 NOTE — Discharge Instructions (Signed)
Myomectomy, Care After Refer to this sheet in the next few weeks. These instructions provide you with information on caring for yourself after your procedure. Your health care provider may also give you more specific instructions. Your treatment has been planned according to current medical practices, but problems sometimes occur. Call your health care provider if you have any problems or questions after your procedure. WHAT TO EXPECT AFTER THE PROCEDURE After your procedure, it is typical to have the following:  Tiredness. This is a normal part of the recovery process. Your energy level will return to normal over the next several weeks.  Constipation.  Vaginal bleeding. This is normal and should stop after 1 2 weeks. HOME CARE INSTRUCTIONS   Only take over-the-counter or prescription medicines as directed by your health care provider. Avoid aspirin because it can cause bleeding.  Do not douche, use tampons, or have sexual intercourse until given permission by your health care provider.  You will probably be able to go back to your normal routine after a few days. Do not do anything that requires extra effort until your health care provider says it is okay. Do not lift anything heavier than 15 pounds (6.8 kg) until your health care provider approves.  Walk daily but take frequent rest breaks if you tire easily.  Continue to practice deep breathing and coughing. If it hurts to cough, try holding a pillow against your belly as you cough.  If you become constipated, you may:  Use a mild laxative if your health care provider approves.  Add more fruit and bran to your diet.  Drink enough fluids to keep your urine clear or pale yellow.  Take your temperature twice a day and write it down.  Do not drink alcohol.  Do not drive until your health care provider approves.  Have someone help you at home for 1 week or until you can do your own household activities.  Follow up with your health  care provider as directed. SEEK MEDICAL CARE IF:  You have a fever.  You have increasing abdominal pain that is not relieved with medicine.  You have nausea, vomiting, or diarrhea.  You have pain when you urinate, or you have blood in your urine.  You have a rash on your body.  You have pain or redness where your IV access tube was inserted.  You have redness, swelling, or any kind of drainage from an incision. SEEK IMMEDIATE MEDICAL CARE IF:   You have weakness or lightheadedness.  You have pain, swelling, or redness in your legs.  You have chest pain.  You faint.  You have shortness of breath.  You have heavy vaginal bleeding.  Your incision is opening up. Document Released: 06/10/2010 Document Revised: 11/08/2012 Document Reviewed: 08/30/2012 [facility] Patient Information 2014 [place].

## 2013-05-22 NOTE — H&P (Signed)
Stacie Nunez is an 48 y.o. female G2P0A2  RP:  Excision of vaginally prolapsed myoma  Pertinent Gynecological History: Menses: flow is excessive with use of many pads or tampons on heaviest days Bleeding: intermenstrual bleeding Contraception: condoms Blood transfusions: none Sexually transmitted diseases: no past history  Last mammogram: normal Last pap: normal  OB History: G2P0A2   Menstrual History:  No LMP recorded.    Past Medical History  Diagnosis Date  . Fibroids   . Dysrhythmia 2014    Irregular HR due to hormonal changes  . Hypertension 2014    brief episode of HTN due to Lupron- no meds currently    Past Surgical History  Procedure Laterality Date  . Wisdom tooth extraction      Family History  Problem Relation Age of Onset  . Hypertension Mother   . Hypertension Father   . Cancer Maternal Grandmother   . Diabetes Paternal Grandmother     Social History:  reports that she has never smoked. She does not have any smokeless tobacco history on file. She reports that she does not drink alcohol or use illicit drugs.  Allergies: No Known Allergies  Prescriptions prior to admission  Medication Sig Dispense Refill  . ascorbic acid (VITAMIN C) 500 MG tablet Take 1,000 mg by mouth daily.      . Chaste Tree (VITEX EXTRACT PO) Take 2 tablets by mouth daily. Vitex Berry      . Digestive Enzymes (PAPAYA AND ENZYMES PO) Take 1 tablet by mouth as needed (for heartburn).      . ferrous sulfate 325 (65 FE) MG tablet Take 325 mg by mouth daily. Mega Food Iron      . ibuprofen (ADVIL,MOTRIN) 200 MG tablet Take 400 mg by mouth every 6 (six) hours as needed. pain      . TURMERIC PO Take 1 capsule by mouth daily.      . medroxyPROGESTERone (DEPO-PROVERA) 150 MG/ML injection Inject 150 mg into the muscle every 30 (thirty) days.         ROS  Blood pressure 136/80, pulse 71, temperature 98.2 F (36.8 C), temperature source Oral, resp. rate 20, height 5\' 7"  (1.702 m),  weight 94.802 kg (209 lb), SpO2 100.00%. Physical Exam  Pelvic US:  Multiple uterine myomas.  Ovaries wnl x 2.  Results for orders placed during the hospital encounter of 05/22/13 (from the past 24 hour(s))  PREGNANCY, URINE     Status: None   Collection Time    05/22/13  1:15 PM      Result Value Ref Range   Preg Test, Ur NEGATIVE  NEGATIVE  CBC     Status: Abnormal   Collection Time    05/22/13  1:25 PM      Result Value Ref Range   WBC 5.2  4.0 - 10.5 K/uL   RBC 4.08  3.87 - 5.11 MIL/uL   Hemoglobin 11.7 (*) 12.0 - 15.0 g/dL   HCT 37.2  36.0 - 46.0 %   MCV 91.2  78.0 - 100.0 fL   MCH 28.7  26.0 - 34.0 pg   MCHC 31.5  30.0 - 36.0 g/dL   RDW 14.2  11.5 - 15.5 %   Platelets 303  150 - 400 K/uL    No results found.  Assessment/Plan:  Vaginally prolapsed myoma for excision, HSC D+C, Excision of Rt inner thigh lipoma.  Surgery and risks reviewed.   Marie-Lyne Lyndsy Gilberto 05/22/2013, 2:31 PM

## 2013-05-22 NOTE — Transfer of Care (Signed)
Immediate Anesthesia Transfer of Care Note  Patient: Stacie Nunez  Procedure(s) Performed: Procedure(s) with comments: VAGINAL MYOMECTOMY (N/A) - 1 hr. DILATATION AND CURETTAGE (N/A) EXCISION LIPOMA (Right) - right upper inner thigh  Patient Location: PACU  Anesthesia Type:General  Level of Consciousness: awake, alert  and oriented  Airway & Oxygen Therapy: Patient Spontanous Breathing and Patient connected to nasal cannula oxygen  Post-op Assessment: Report given to PACU RN and Post -op Vital signs reviewed and stable  Post vital signs: Reviewed and stable  Complications: No apparent anesthesia complications

## 2013-05-22 NOTE — Anesthesia Preprocedure Evaluation (Signed)
Anesthesia Evaluation  Patient identified by MRN, date of birth, ID band Patient awake    Reviewed: Allergy & Precautions, H&P , NPO status , Patient's Chart, lab work & pertinent test results  Airway Mallampati: I TM Distance: >3 FB Neck ROM: full    Dental no notable dental hx. (+) Teeth Intact   Pulmonary neg pulmonary ROS,    Pulmonary exam normal       Cardiovascular hypertension,     Neuro/Psych negative neurological ROS  negative psych ROS   GI/Hepatic negative GI ROS, Neg liver ROS,   Endo/Other  negative endocrine ROS  Renal/GU negative Renal ROS     Musculoskeletal   Abdominal Normal abdominal exam  (+)   Peds  Hematology negative hematology ROS (+)   Anesthesia Other Findings   Reproductive/Obstetrics negative OB ROS                           Anesthesia Physical Anesthesia Plan  ASA: I  Anesthesia Plan: General   Post-op Pain Management:    Induction: Intravenous  Airway Management Planned: LMA  Additional Equipment:   Intra-op Plan:   Post-operative Plan:   Informed Consent: I have reviewed the patients History and Physical, chart, labs and discussed the procedure including the risks, benefits and alternatives for the proposed anesthesia with the patient or authorized representative who has indicated his/her understanding and acceptance.     Plan Discussed with: CRNA and Surgeon  Anesthesia Plan Comments:         Anesthesia Quick Evaluation

## 2013-05-22 NOTE — Discharge Summary (Signed)
  Physician Discharge Summary  Patient ID: Stacie Nunez MRN: [id number] DOB/AGE: [birth year] 48 y.o.  Admit date: 05/22/2013 Discharge date: 05/22/2013  Admission Diagnoses: Vaginally Prolapsed Myoma, right upper inner thigh lipoma  Discharge Diagnoses: Vaginally Prolapsed Myoma, right upper inner thigh lipoma        Active Problems:   * No active hospital problems. *   Discharged Condition: good  Hospital Course: Outpatient  Consults: None  Treatments: surgery: Excision of vaginally prolapsed myoma, diagnostic Hysteroscopy, excision of lipoma  Disposition: 01-Home or Self Care     Medication List         ascorbic acid 500 MG tablet  Commonly known as:  VITAMIN C  Take 1,000 mg by mouth daily.     ferrous sulfate 325 (65 FE) MG tablet  Take 325 mg by mouth daily. Mega Food Iron     hydrocodone-ibuprofen 5-200 MG per tablet  Commonly known as:  VICOPROFEN  Take 1 tablet by mouth every 8 (eight) hours as needed for pain.     ibuprofen 200 MG tablet  Commonly known as:  ADVIL,MOTRIN  Take 400 mg by mouth every 6 (six) hours as needed. pain     medroxyPROGESTERone 150 MG/ML injection  Commonly known as:  DEPO-PROVERA  Inject 150 mg into the muscle every 30 (thirty) days.     PAPAYA AND ENZYMES PO  Take 1 tablet by mouth as needed (for heartburn).     TURMERIC PO  Take 1 capsule by mouth daily.     VITEX EXTRACT PO  Take 2 tablets by mouth daily. Vitex Berry           Follow-up Information   Follow up with Stacie Nunez,MARIE-LYNE, MD In 3 weeks.   Specialty:  Obstetrics and Gynecology   Contact information:   [address] [address] [phone]       Signed: Princess Bruins, MD 05/22/2013, 4:31 PM

## 2013-05-22 NOTE — Op Note (Signed)
05/22/2013  4:12 PM  PATIENT:  Stacie Nunez  48 y.o. female  PRE-OPERATIVE DIAGNOSIS:  Vaginally Prolapsed Myoma, Lipoma at right upper inner thigh  POST-OPERATIVE DIAGNOSIS:  Vaginally Prolapsed Myoma, Lipoma at right upper inner thigh.  PROCEDURE:  Procedure(s): VAGINAL MYOMECTOMY, DIAGNOSTIC HYSTEROSCOPY, EXCISION OF LIPOMA  SURGEON:  Surgeon(s): Princess Bruins, MD  ASSISTANTS: none   ANESTHESIA:   general  PROCEDURE:  Under general anesthesia with laryngeal mask, the patient is in lithotomy position.  She is prepped with Betadine on the suprapubic, vulvar and vaginal areas.  She is draped as usual.  The bladder was catheterized.  The vaginal exam reveals a large irregular uterus, no adnexal mass felt and a vaginally prolapse myoma is present which has a diameter of about 7 cm.  The weighted speculum is inserted in the vagina.  The myoma is grasped with a thyroid clamp.  We attempted passing an Endoloop but the diameter of the Endoloop was too small to go around the myoma.  We therefore decided to on used the knife to resect the middle portion of the myoma in a cylindrical fashion.  That resection reduce the diameter of the myoma and we were then able to pass 2 Endoloops around the pedicle of the myoma.  The second Endoloop was pushed as far in as possible.  We then used coagulation to resect the pedicle between the 2 Endoloops.  Hemostasis was adequate.  The pedicle had a diameter of about 1.5 cm.  We then grasped the anterior lip of the cervix with a tenaculum.  The cervix was already dilated to at least a 1.5 cm.  We inserted the hysteroscope easily inside the intrauterine cavity.  We used a second tenaculum to close the cervix in order to preserve the hysteroscopy fluid inside the intrauterine cavity.  A very large submucosal myoma was present and the cavity on the wide pedicle. Other myomas were protruding from the walls.  The latter were probably intramural myomas with a  submucosal component.  Given the large myoma was in the intrauterine cavity with visible blood vessels at their surfaces, the decision was made not to proceed with a curettage of the intrauterine cavity because of the risk of hemorrhage.  The hysteroscope was removed from the intrauterine cavity. The tenaculums were removed from the cervix. Hemostasis was adequate. The Endoloop suture which was kept on a hemostat was cut with suture scissors at the level of the cervix.  We then proceeded with excision of the right upper inner thigh lipoma.  An elliptical incision was made with the scalpel after infiltration of Nesacaine 1% 10 cc.  The lipoma was excised and sent to pathology. Hemostasis was completed with coagulation using the Bovie.  We reapproximated the adipose tissue and the subcutaneous tissue with a Vicryl 3-0 in a running suture. The incision was closed with Vicryl 3-0 in a subcuticular stitch. Dermabond was added.  Hemostasis was adequate at all levels.  The patient was brought to recovery room in good and stable status.  ESTIMATED BLOOD LOSS: 50 cc FLUID DEFICIT: 100 CC   Intake/Output Summary (Last 24 hours) at 05/22/13 1612 Last data filed at 05/22/13 1559  Gross per 24 hour  Intake   1000 ml  Output    150 ml  Net    850 ml     BLOOD ADMINISTERED:none   LOCAL MEDICATIONS USED:  Nesacaine 1%  SPECIMEN:  Source of Specimen:  Uterine myoma  DISPOSITION OF SPECIMEN:  PATHOLOGY  COUNTS:  YES  PLAN OF CARE: Transfer to PACU  [name]  05/22/2013 at 4:12 pm

## 2013-05-22 NOTE — Anesthesia Postprocedure Evaluation (Signed)
Anesthesia Post Note  Patient: Stacie Nunez  Procedure(s) Performed: Procedure(s) (LRB): VAGINAL MYOMECTOMY (N/A) DILATATION AND CURETTAGE (N/A) EXCISION LIPOMA (Right)  Anesthesia type: General  Patient location: PACU  Post pain: Pain level controlled  Post assessment: Post-op Vital signs reviewed  Last Vitals:  Filed Vitals:   05/22/13 1645  BP:   Pulse: 69  Temp:   Resp: 20    Post vital signs: Reviewed  Level of consciousness: sedated  Complications: No apparent anesthesia complications

## 2013-05-23 ENCOUNTER — Encounter (HOSPITAL_COMMUNITY): Payer: Self-pay | Admitting: Obstetrics & Gynecology

## 2013-07-04 ENCOUNTER — Other Ambulatory Visit: Payer: Self-pay

## 2013-07-04 DIAGNOSIS — Z1231 Encounter for screening mammogram for malignant neoplasm of breast: Secondary | ICD-10-CM

## 2013-07-24 ENCOUNTER — Encounter (INDEPENDENT_AMBULATORY_CARE_PROVIDER_SITE_OTHER): Payer: Self-pay

## 2013-07-24 ENCOUNTER — Ambulatory Visit
Admission: RE | Admit: 2013-07-24 | Discharge: 2013-07-24 | Disposition: A | Discharge status: Home / Self Care | Payer: BC Managed Care – PPO | Source: Ambulatory Visit

## 2013-07-24 DIAGNOSIS — Z1231 Encounter for screening mammogram for malignant neoplasm of breast: Secondary | ICD-10-CM

## 2013-12-03 ENCOUNTER — Encounter (HOSPITAL_COMMUNITY): Payer: Self-pay | Admitting: Obstetrics & Gynecology

## 2014-10-10 ENCOUNTER — Telehealth: Payer: Self-pay | Admitting: *Deleted

## 2014-10-10 NOTE — Telephone Encounter (Signed)
08 Pap recall due 08/2014 due to ASCUS with neg HR HPV in 2013.  Pt is in recall for 3 year recall.  Past History:   07/26/11 Pap, ASCUS with neg HR HPV  Pt has not scheduled AEX with Edman Circle, FNP.  Per letter in paper chart dated 12/06/11, pt has decided to transfer care.  Surgery note in EPIC for hysterectomy performed by physician in another practice dated 05/22/13.  Please advise recall.   Paper chart available if needed.

## 2014-10-10 NOTE — Telephone Encounter (Signed)
She has a vaginal myomectomy, not hysterectomy, but she has transferred care and it is appropriate to remove from recall.  Thanks.

## 2014-10-11 NOTE — Telephone Encounter (Signed)
Pt removed from recall.   Closing encounter. 

## 2014-11-14 ENCOUNTER — Other Ambulatory Visit: Payer: Self-pay

## 2014-11-14 DIAGNOSIS — Z1231 Encounter for screening mammogram for malignant neoplasm of breast: Secondary | ICD-10-CM

## 2014-11-26 ENCOUNTER — Ambulatory Visit
Admission: RE | Admit: 2014-11-26 | Discharge: 2014-11-26 | Disposition: A | Discharge status: Home / Self Care | Payer: 59 | Source: Ambulatory Visit

## 2014-11-26 DIAGNOSIS — Z1231 Encounter for screening mammogram for malignant neoplasm of breast: Secondary | ICD-10-CM

## 2015-10-08 ENCOUNTER — Other Ambulatory Visit: Payer: Self-pay | Admitting: Obstetrics & Gynecology

## 2015-10-08 DIAGNOSIS — Z1231 Encounter for screening mammogram for malignant neoplasm of breast: Secondary | ICD-10-CM

## 2015-12-09 ENCOUNTER — Ambulatory Visit
Admission: RE | Admit: 2015-12-09 | Discharge: 2015-12-09 | Disposition: A | Discharge status: Home / Self Care | Payer: 59 | Source: Ambulatory Visit | Attending: Obstetrics & Gynecology | Admitting: Obstetrics & Gynecology

## 2015-12-09 DIAGNOSIS — Z1231 Encounter for screening mammogram for malignant neoplasm of breast: Secondary | ICD-10-CM

## 2016-04-15 ENCOUNTER — Other Ambulatory Visit: Payer: Self-pay | Admitting: Obstetrics & Gynecology

## 2016-04-15 DIAGNOSIS — Z30431 Encounter for routine checking of intrauterine contraceptive device: Secondary | ICD-10-CM

## 2016-04-19 ENCOUNTER — Other Ambulatory Visit: Payer: 59

## 2016-04-21 ENCOUNTER — Ambulatory Visit
Admission: RE | Admit: 2016-04-21 | Discharge: 2016-04-21 | Disposition: A | Discharge status: Home / Self Care | Payer: 59 | Source: Ambulatory Visit | Attending: Obstetrics & Gynecology | Admitting: Obstetrics & Gynecology

## 2016-04-21 ENCOUNTER — Other Ambulatory Visit: Payer: Self-pay | Admitting: Obstetrics & Gynecology

## 2016-04-21 DIAGNOSIS — T8332XA Displacement of intrauterine contraceptive device, initial encounter: Secondary | ICD-10-CM

## 2016-04-21 DIAGNOSIS — T8389XA Other specified complication of genitourinary prosthetic devices, implants and grafts, initial encounter: Principal | ICD-10-CM

## 2016-05-14 ENCOUNTER — Ambulatory Visit: Payer: Self-pay | Admitting: Obstetrics & Gynecology

## 2016-05-21 ENCOUNTER — Telehealth: Payer: Self-pay | Admitting: *Deleted

## 2016-05-21 NOTE — Telephone Encounter (Signed)
You sent Stacie Nunez a staff message to have patient pelvic ultrasound, Mirena IUD had fell out.  Pt received a call from front desk to schedule a pelvic ultrasound/possible endometrial Bx due to her age with menometro/fibroids.  Pt said she had pelvic ultrasound done on 04/13/16 at [facility], paper chart from annual visit 03/30/16 states this ultrasound was order because strings were not visible. Pt believes the bleeding is due to the Mirena IUD falling out as she has done great with IUD. She would prefer to have another IUD inserted. I tried to explain to the patient that you were ordering due to bleeding/fibroids.  Her question is another ultrasound mandatory? Please advise

## 2016-05-22 NOTE — Telephone Encounter (Signed)
I understand and agree, but I want to be thorough.  We could do only Endometrial biopsy and IUD insertion the same visit.  I would feel more reassured if I was making sure I'm not missing Endometrial hyperplasia/Cancer by doing an EBx.

## 2016-05-24 NOTE — Telephone Encounter (Signed)
Left a detailed message on voicemail with the below.

## 2016-05-24 NOTE — Telephone Encounter (Signed)
Left message for pt to call.

## 2016-05-24 NOTE — Telephone Encounter (Signed)
Patient informed the below, she asked if ultrasound will be done with IUD insertion? Pt said last time you did this, I explained you didn't mention ultrasound, states you did ultrasound last time due to her fibroids. Pt may come back for IUD insertion at a later date and have endometrial Bx first, as finances are her concern.

## 2016-05-24 NOTE — Telephone Encounter (Signed)
Good idea to come for EBx first and we will decide further plans then.

## 2017-01-04 ENCOUNTER — Other Ambulatory Visit: Payer: Self-pay | Admitting: Obstetrics & Gynecology

## 2017-01-04 DIAGNOSIS — Z139 Encounter for screening, unspecified: Secondary | ICD-10-CM

## 2017-02-08 ENCOUNTER — Ambulatory Visit
Admission: RE | Admit: 2017-02-08 | Discharge: 2017-02-08 | Disposition: A | Discharge status: Home / Self Care | Payer: 59 | Source: Ambulatory Visit | Attending: Obstetrics & Gynecology | Admitting: Obstetrics & Gynecology

## 2017-02-08 DIAGNOSIS — Z139 Encounter for screening, unspecified: Secondary | ICD-10-CM

## 2018-01-31 ENCOUNTER — Other Ambulatory Visit: Payer: Self-pay | Admitting: Obstetrics & Gynecology

## 2018-01-31 DIAGNOSIS — Z1231 Encounter for screening mammogram for malignant neoplasm of breast: Secondary | ICD-10-CM

## 2018-02-17 ENCOUNTER — Ambulatory Visit: Payer: 59

## 2018-02-24 ENCOUNTER — Ambulatory Visit (INDEPENDENT_AMBULATORY_CARE_PROVIDER_SITE_OTHER): Payer: 59 | Admitting: Orthopaedic Surgery

## 2018-03-16 ENCOUNTER — Ambulatory Visit: Payer: 59

## 2018-04-13 ENCOUNTER — Ambulatory Visit: Payer: 59

## 2018-10-03 ENCOUNTER — Ambulatory Visit: Payer: 59

## 2018-10-31 ENCOUNTER — Ambulatory Visit
Admission: RE | Admit: 2018-10-31 | Discharge: 2018-10-31 | Disposition: A | Discharge status: Home / Self Care | Payer: 59 | Source: Ambulatory Visit | Attending: Obstetrics & Gynecology | Admitting: Obstetrics & Gynecology

## 2018-10-31 ENCOUNTER — Other Ambulatory Visit: Payer: Self-pay

## 2018-10-31 DIAGNOSIS — Z1231 Encounter for screening mammogram for malignant neoplasm of breast: Secondary | ICD-10-CM

## 2018-11-14 ENCOUNTER — Other Ambulatory Visit: Payer: Self-pay

## 2018-11-14 DIAGNOSIS — Z20822 Contact with and (suspected) exposure to covid-19: Secondary | ICD-10-CM

## 2018-11-16 LAB — NOVEL CORONAVIRUS, NAA: SARS-CoV-2, NAA: NOT DETECTED

## 2019-12-03 ENCOUNTER — Other Ambulatory Visit: Payer: Self-pay | Admitting: Obstetrics & Gynecology

## 2019-12-03 DIAGNOSIS — Z1231 Encounter for screening mammogram for malignant neoplasm of breast: Secondary | ICD-10-CM

## 2020-01-15 ENCOUNTER — Ambulatory Visit: Payer: 59

## 2020-02-05 ENCOUNTER — Ambulatory Visit
Admission: RE | Admit: 2020-02-05 | Discharge: 2020-02-05 | Disposition: A | Discharge status: Home / Self Care | Payer: 59 | Source: Ambulatory Visit | Attending: Obstetrics & Gynecology | Admitting: Obstetrics & Gynecology

## 2020-02-05 ENCOUNTER — Other Ambulatory Visit: Payer: Self-pay

## 2020-02-05 DIAGNOSIS — Z1231 Encounter for screening mammogram for malignant neoplasm of breast: Secondary | ICD-10-CM

## 2020-03-18 ENCOUNTER — Ambulatory Visit: Payer: 59

## 2020-03-25 ENCOUNTER — Ambulatory Visit: Payer: 59 | Admitting: Physician Assistant

## 2020-03-25 ENCOUNTER — Other Ambulatory Visit: Payer: Self-pay

## 2020-03-25 ENCOUNTER — Encounter: Payer: Self-pay | Admitting: Physician Assistant

## 2020-03-25 DIAGNOSIS — C4491 Basal cell carcinoma of skin, unspecified: Secondary | ICD-10-CM

## 2020-03-25 DIAGNOSIS — C4441 Basal cell carcinoma of skin of scalp and neck: Secondary | ICD-10-CM

## 2020-03-25 DIAGNOSIS — Z1283 Encounter for screening for malignant neoplasm of skin: Secondary | ICD-10-CM | POA: Diagnosis not present

## 2020-03-25 DIAGNOSIS — L821 Other seborrheic keratosis: Secondary | ICD-10-CM

## 2020-03-25 DIAGNOSIS — D2262 Melanocytic nevi of left upper limb, including shoulder: Secondary | ICD-10-CM | POA: Diagnosis not present

## 2020-03-25 DIAGNOSIS — D485 Neoplasm of uncertain behavior of skin: Secondary | ICD-10-CM

## 2020-03-25 DIAGNOSIS — D229 Melanocytic nevi, unspecified: Secondary | ICD-10-CM

## 2020-03-25 HISTORY — DX: Melanocytic nevi, unspecified: D22.9

## 2020-03-25 HISTORY — DX: Basal cell carcinoma of skin, unspecified: C44.91

## 2020-03-25 NOTE — Progress Notes (Addendum)
   Follow-Up Visit   Subjective  Stacie Nunez is a 55 y.o. female who presents for the following: Annual Exam (Wants full skin check-no new concerns).   The following portions of the chart were reviewed this encounter and updated as appropriate:  Tobacco  Allergies  Meds  Problems  Med Hx  Surg Hx  Fam Hx      Objective  Well appearing patient in no apparent distress; mood and affect are within normal limits.  A full examination was performed including scalp, head, eyes, ears, nose, lips, neck, chest, axillae, abdomen, back, buttocks, bilateral upper extremities, bilateral lower extremities, hands, feet, fingers, toes, fingernails, and toenails. All findings within normal limits unless otherwise noted below.  Objective  head to toe: Full body skin examination  Objective  Left Forearm - Anterior: Dark pigmented macule     Objective  Left Lower Leg - Posterior: Multiple Stuck-on, waxy, tan-brown papules and plaques. --Discussed benign etiology and prognosis.   Objective  Right Frontal Scalp: Pink pearly papule     Assessment & Plan  Encounter for screening for malignant neoplasm of skin head to toe  Yearly skin check  Neoplasm of uncertain behavior of skin Left Forearm - Anterior  Skin / nail biopsy Type of biopsy: tangential   Informed consent: discussed and consent obtained   Timeout: patient name, date of birth, surgical site, and procedure verified   Procedure prep:  Patient was prepped and draped in usual sterile fashion (Non sterile) Prep type:  Chlorhexidine Anesthesia: the lesion was anesthetized in a standard fashion   Anesthetic:  1% lidocaine w/ epinephrine 1-100,000 local infiltration Instrument used: flexible razor blade   Outcome: patient tolerated procedure well   Post-procedure details: wound care instructions given    Specimen 1 - Surgical pathology Differential Diagnosis: r/ o atypia  Check Margins: No  Seborrheic  keratosis Left Lower Leg - Posterior  Okay to leave if stable  Basal cell carcinoma (BCC) of scalp Right Frontal Scalp  Skin / nail biopsy Type of biopsy: tangential   Informed consent: discussed and consent obtained   Timeout: patient name, date of birth, surgical site, and procedure verified   Procedure prep:  Patient was prepped and draped in usual sterile fashion (Non sterile) Prep type:  Chlorhexidine Anesthesia: the lesion was anesthetized in a standard fashion   Anesthetic:  1% lidocaine w/ epinephrine 1-100,000 local infiltration Instrument used: flexible razor blade   Outcome: patient tolerated procedure well   Post-procedure details: wound care instructions given    Specimen 2 - Surgical pathology Differential Diagnosis: bcc vs scc  Check Margins: No   I, Mekala Winger, PA-C, have reviewed all documentation's for this visit.  The documentation on 03/27/20 for the exam, diagnosis, procedures and orders are all accurate and complete.

## 2020-03-25 NOTE — Patient Instructions (Signed)

## 2020-03-31 ENCOUNTER — Encounter: Payer: Self-pay | Admitting: *Deleted

## 2020-03-31 ENCOUNTER — Telehealth: Payer: Self-pay | Admitting: *Deleted

## 2020-03-31 NOTE — Telephone Encounter (Signed)
-----   Message from Warren Danes, [place] sent at 03/27/2020  5:12 PM EST ----- Recheck 1 in 6 months

## 2020-03-31 NOTE — Telephone Encounter (Signed)
Pathology to patient- sent referral to [facility] for MOHS.  6 month recheck appointment scheduled.

## 2020-04-01 ENCOUNTER — Telehealth: Payer: Self-pay | Admitting: Physician Assistant

## 2020-04-01 NOTE — Telephone Encounter (Signed)
Left message for patient to call back  

## 2020-04-01 NOTE — Telephone Encounter (Signed)
Patient has more questions about pathology results and [facility] visit.

## 2020-04-01 NOTE — Telephone Encounter (Signed)
Patient called back with question about MOHS procedure- answered all her questions and told her referral done and that she should be hearing from [facility] within the week.  Told to call us if she hasn't heard from them.

## 2020-04-29 ENCOUNTER — Ambulatory Visit
Admission: RE | Admit: 2020-04-29 | Discharge: 2020-04-29 | Disposition: A | Discharge status: Home / Self Care | Payer: 59 | Source: Ambulatory Visit | Attending: Obstetrics & Gynecology | Admitting: Obstetrics & Gynecology

## 2020-04-29 ENCOUNTER — Other Ambulatory Visit: Payer: Self-pay

## 2020-05-15 ENCOUNTER — Telehealth: Payer: Self-pay | Admitting: *Deleted

## 2020-05-15 NOTE — Telephone Encounter (Signed)
Patient called and left message c/o neck nodule asked if Dr.Lavoie could look at this area at her appointment on 07/30/20. I explained to patient she will need to get established with a PCP or go Urgent Care for now they can order any imaging or blood work. Patient verbalized she understood.

## 2020-06-09 ENCOUNTER — Encounter: Payer: Self-pay | Admitting: Physician Assistant

## 2020-06-09 ENCOUNTER — Telehealth: Payer: 59 | Admitting: Physician Assistant

## 2020-06-09 DIAGNOSIS — U071 COVID-19: Secondary | ICD-10-CM

## 2020-06-09 NOTE — Patient Instructions (Signed)
Hello Stacie "NIKKI",  You are being placed in the home monitoring program for COVID-19 (commonly known as Coronavirus).  This is because you are suspected to have the virus or are known to have the virus.  If you are unsure which group you fall into call your clinic.    As part of this program, you'll answer a daily questionnaire in the MyChart mobile app. You'll receive a notification through the MyChart app when the questionnaire is available. When you log in to MyChart, you'll see the tasks in your To Do activity.       Clinicians will see any answers that are concerning and take appropriate steps.  If at any point you are having a medical emergency, call 911.  If otherwise concerned call your clinic instead of coming into the clinic or hospital.  To keep from spreading the disease you should: Stay home and limit contact with other people as much as possible.  Wash your hands frequently. Cover your coughs and sneezes with a tissue, and throw used tissues in the trash.   Clean and disinfect frequently touched surfaces and objects.    Take care of yourself by: Staying home Resting Drinking fluids Take fever-reducing medications (Tylenol/Acetaminophen and Ibuprofen)  For more information on the disease go to the Centers for Disease Control and Prevention website     Can take to lessen severity: Vit C 500mg  twice daily Quercertin 250-500mg  twice daily Zinc 75-100mg  daily Melatonin 3-6 mg at bedtime Vit D3 1000-2000 IU daily Aspirin 81 mg daily with food Optional: Famotidine 20mg  daily Also can add tylenol/ibuprofen as needed for fevers and body aches May add Mucinex or Mucinex DM as needed for cough/congestion   10 Things You Can Do to Manage Your COVID-19 Symptoms at Home If you have possible or confirmed COVID-19: 1. Stay home except to get medical care. 2. Monitor your symptoms carefully. If your symptoms get worse, call your healthcare provider immediately. 3. Get rest and  stay hydrated. 4. If you have a medical appointment, call the healthcare provider ahead of time and tell them that you have or may have COVID-19. 5. For medical emergencies, call 911 and notify the dispatch personnel that you have or may have COVID-19. 6. Cover your cough and sneezes with a tissue or use the inside of your elbow. 7. Wash your hands often with soap and water for at least 20 seconds or clean your hands with an alcohol-based hand sanitizer that contains at least 60% alcohol. 8. As much as possible, stay in a specific room and away from other people in your home. Also, you should use a separate bathroom, if available. If you need to be around other people in or outside of the home, wear a mask. 9. Avoid sharing personal items with other people in your household, like dishes, towels, and bedding. 10. Clean all surfaces that are touched often, like counters, tabletops, and doorknobs. Use household cleaning sprays or wipes according to the label instructions. June 08/17/2019 This information is not intended to replace advice given to you by your health care provider. Make sure you discuss any questions you have with your health care provider. Document Revised: 12/03/2019 Document Reviewed: 12/03/2019 Elsevier Patient Education  2021 [employer]: What to Do if You Are Sick If you have a fever, cough or other symptoms, you might have COVID-19. Most people have mild illness and are able to recover at home. If you are sick:  Keep track of  your symptoms.  If you have an emergency warning sign (including trouble breathing), call 911. Steps to help prevent the spread of COVID-19 if you are sick If you are sick with COVID-19 or think you might have COVID-19, follow the steps below to care for yourself and to help protect other people in your home and community. Stay home except to get medical care  Stay home. Most people with COVID-19 have mild illness and can  recover at home without medical care. Do not leave your home, except to get medical care. Do not visit public areas.  Take care of yourself. Get rest and stay hydrated. Take over-the-counter medicines, such as acetaminophen, to help you feel better.  Stay in touch with your doctor. Call before you get medical care. Be sure to get care if you have trouble breathing, or have any other emergency warning signs, or if you think it is an emergency.  Avoid public transportation, ride-sharing, or taxis. Separate yourself from other people As much as possible, stay in a specific room and away from other people and pets in your home. If possible, you should use a separate bathroom. If you need to be around other people or animals in or outside of the home, wear a mask. Tell your close contactsthat they may have been exposed to COVID-19. An infected person can spread COVID-19 starting 48 hours (or 2 days) before the person has any symptoms or tests positive. By letting your close contacts know they may have been exposed to COVID-19, you are helping to protect everyone.  Additional guidance is available for those living in close quarters and shared housing.  See COVID-19 and Animals if you have questions about pets.  If you are diagnosed with COVID-19, someone from the health department may call you. Answer the call to slow the spread. Monitor your symptoms  Symptoms of COVID-19 include fever, cough, or other symptoms.  Follow care instructions from your healthcare provider and local health department. Your local health authorities may give instructions on checking your symptoms and reporting information. When to seek emergency medical attention Look for emergency warning signs* for COVID-19. If someone is showing any of these signs, seek emergency medical care immediately:  Trouble breathing  Persistent pain or pressure in the chest  New confusion  Inability to wake or stay awake  Pale, gray, or  blue-colored skin, lips, or nail beds, depending on skin tone *This list is not all possible symptoms. Please call your medical provider for any other symptoms that are severe or concerning to you. Call 911 or call ahead to your local emergency facility: Notify the operator that you are seeking care for someone who has or may have COVID-19. Call ahead before visiting your doctor  Call ahead. Many medical visits for routine care are being postponed or done by phone or telemedicine.  If you have a medical appointment that cannot be postponed, call your doctor's office, and tell them you have or may have COVID-19. This will help the office protect themselves and other patients. Get  tested  If you have symptoms of COVID-19, get tested. While waiting for test results, you stay away from others, including staying apart from those living in your household.  You can visit your state, tribal, local, and territorialhealth department's website to look for the latest local information on testing sites. If you are sick, wear a mask over your nose and mouth  You should wear a mask over your nose and mouth if you  must be around other people or animals, including pets (even at home).  You don't need to wear the mask if you are alone. If you can't put on a mask (because of trouble breathing, for example), cover your coughs and sneezes in some other way. Try to stay at least 6 feet away from other people. This will help protect the people around you.  Masks should not be placed on young children under age 1 years, anyone who has trouble breathing, or anyone who is not able to remove the mask without help. Note: During the COVID-19 pandemic, medical grade facemasks are reserved for healthcare workers and some first responders. Cover your coughs and sneezes  Cover your mouth and nose with a tissue when you cough or sneeze.  Throw away used tissues in a lined trash can.  Immediately wash your hands with soap  and water for at least 20 seconds. If soap and water are not available, clean your hands with an alcohol-based hand sanitizer that contains at least 60% alcohol. Clean your hands often  Wash your hands often with soap and water for at least 20 seconds. This is especially important after blowing your nose, coughing, or sneezing; going to the bathroom; and before eating or preparing food.  Use hand sanitizer if soap and water are not available. Use an alcohol-based hand sanitizer with at least 60% alcohol, covering all surfaces of your hands and rubbing them together until they feel dry.  Soap and water are the best option, especially if hands are visibly dirty.  Avoid touching your eyes, nose, and mouth with unwashed hands.  Handwashing Tips Avoid sharing personal household items  Do not share dishes, drinking glasses, cups, eating utensils, towels, or bedding with other people in your home.  Wash these items thoroughly after using them with soap and water or put in the dishwasher. Clean all "high-touch" surfaces everyday  Clean and disinfect high-touch surfaces in your "sick room" and bathroom; wear disposable gloves. Let someone else clean and disinfect surfaces in common areas, but you should clean your bedroom and bathroom, if possible.  If a caregiver or other person needs to clean and disinfect a sick person's bedroom or bathroom, they should do so on an as-needed basis. The caregiver/other person should wear a mask and disposable gloves prior to cleaning. They should wait as long as possible after the person who is sick has used the bathroom before coming in to clean and use the bathroom. ? High-touch surfaces include phones, remote controls, counters, tabletops, doorknobs, bathroom fixtures, toilets, keyboards, tablets, and bedside tables.  Clean and disinfect areas that may have blood, stool, or body fluids on them.  Use household cleaners and disinfectants. Clean the area or item  with soap and water or another detergent if it is dirty. Then, use a household disinfectant. ? Be sure to follow the instructions on the label to ensure safe and effective use of the product. Many products recommend keeping the surface wet for several minutes to ensure germs are killed. Many also recommend precautions such as wearing gloves and making sure you have good ventilation during use of the product. ? Use a product from [employer] List N: Disinfectants for Coronavirus (FUXNA-35). ? Complete Disinfection Guidance When you can be around others after being sick with COVID-19 Deciding when you can be around others is different for different situations. Find out when you can safely end home isolation. For any additional questions about your care, contact your healthcare provider or  state or local health department. 04/18/2019 Content source: [facility] for Immunization and Respiratory Diseases ([facility]), Division of Viral Diseases This information is not intended to replace advice given to you by your health care provider. Make sure you discuss any questions you have with your health care provider. Document Revised: 12/03/2019 Document Reviewed: 12/03/2019 Elsevier Patient Education  2021 [employer].

## 2020-06-09 NOTE — Progress Notes (Signed)
[name] are scheduled for a virtual visit with your provider today.    Just as we do with appointments in the office, we must obtain your consent to participate.  Your consent will be active for this visit and any virtual visit you may have with one of our providers in the next 365 days.    If you have a MyChart account, I can also send a copy of this consent to you electronically.  All virtual visits are billed to your insurance company just like a traditional visit in the office.  As this is a virtual visit, video technology does not allow for your provider to perform a traditional examination.  This may limit your provider's ability to fully assess your condition.  If your provider identifies any concerns that need to be evaluated in person or the need to arrange testing such as labs, EKG, etc, we will make arrangements to do so.    Although advances in technology are sophisticated, we cannot ensure that it will always work on either your end or our end.  If the connection with a video visit is poor, we may have to switch to a telephone visit.  With either a video or telephone visit, we are not always able to ensure that we have a secure connection.   I need to obtain your verbal consent now.   Are you willing to proceed with your visit today?   Stacie Nunez has provided verbal consent on 06/09/2020 for a virtual visit (video or telephone).   Mar Daring, PA-C 06/09/2020  11:04 AM    MyChart Video Visit    Virtual Visit via Video Note   This visit type was conducted due to national recommendations for restrictions regarding the COVID-19 Pandemic (e.g. social distancing) in an effort to limit this patient's exposure and mitigate transmission in our community. This patient is at least at moderate risk for complications without adequate follow up. This format is felt to be most appropriate for this patient at this time. Physical exam was limited by quality of the video  and audio technology used for the visit.   Interactive audio and video communications were attempted, although failed due to patient's inability to connect to video. Continued visit with audio only interaction with patient agreement.  Patient location: Home Provider location: Home office in [place]  I discussed the limitations of evaluation and management by telemedicine and the availability of in person appointments. The patient expressed understanding and agreed to proceed.  Patient: Stacie Nunez   DOB: 1965/04/11   54 y.o. Female  MRN: [id number] Visit Date: 06/09/2020  Today's healthcare provider: Mar Daring, PA-C   No chief complaint on file.  Subjective    URI  This is a new problem. The current episode started yesterday (symptoms started overnight). The problem has been gradually worsening. There has been no fever. Associated symptoms include congestion, headaches, nausea, rhinorrhea, sinus pain and a sore throat. Pertinent negatives include no coughing or ear pain. She has tried nothing for the symptoms.    Tested positive for covid 19 this morning with home test  Patient Active Problem List   Diagnosis Date Noted  . Essential hypertension, benign 05/16/2012   Past Medical History:  Diagnosis Date  . Atypical mole 03/25/2020   mod-left forearm-anterior -   . Basal cell carcinoma 03/25/2020   nod-right frontal scalp - MOHS  . 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 Fibroids   . Hypertension 2014   brief episode of HTN due to Lupron- no meds currently      Medications: Outpatient Medications Prior to Visit  Medication Sig  . ascorbic acid (VITAMIN C) 500 MG tablet Take 1,000 mg by mouth daily.  . Chaste Tree (VITEX EXTRACT PO) Take 2 tablets by mouth daily. Vitex Berry  . Digestive Enzymes (PAPAYA AND ENZYMES PO) Take 1 tablet by mouth as needed (for heartburn).  . ferrous sulfate 325 (65 FE) MG tablet Take 325 mg by mouth  daily. Mega Food Iron  . hydrocodone-ibuprofen (VICOPROFEN) 5-200 MG per tablet Take 1 tablet by mouth every 8 (eight) hours as needed for pain. (Patient not taking: Reported on 03/25/2020)  . ibuprofen (ADVIL,MOTRIN) 200 MG tablet Take 400 mg by mouth every 6 (six) hours as needed. pain  . medroxyPROGESTERone (DEPO-PROVERA) 150 MG/ML injection Inject 150 mg into the muscle every 30 (thirty) days.  (Patient not taking: Reported on 03/25/2020)  . TURMERIC PO Take 1 capsule by mouth daily.   No facility-administered medications prior to visit.    Review of Systems  Constitutional: Negative for appetite change, chills, fatigue and fever.  HENT: Positive for congestion, rhinorrhea, sinus pain and sore throat. Negative for ear pain.   Respiratory: Negative for cough and chest tightness.   Gastrointestinal: Positive for nausea.  Neurological: Positive for headaches.    Last CBC Lab Results  Component Value Date   WBC 5.2 05/22/2013   HGB 11.7 (L) 05/22/2013   HCT 37.2 05/22/2013   MCV 91.2 05/22/2013   MCH 28.7 05/22/2013   RDW 14.2 05/22/2013   PLT 303 29/93/7169   Last metabolic panel Lab Results  Component Value Date   GLUCOSE 87 01/25/2012   NA 139 02/15/2012   K 4.7 02/15/2012   CL 107 01/25/2012   CO2 21 01/25/2012   BUN 10 02/15/2012   CREATININE 0.8 02/15/2012   GFRNONAA >90 12/05/2011   GFRAA >90 12/05/2011   CALCIUM 9.2 01/25/2012   PROT 6.1 12/05/2011   ALBUMIN 3.2 (L) 12/05/2011   BILITOT 0.1 (L) 12/05/2011   ALKPHOS 54 02/16/2012   AST 17 02/16/2012   ALT 18 02/16/2012      Objective    There were no vitals taken for this visit. BP Readings from Last 3 Encounters:  05/22/13 115/76  02/16/12 130/84  01/25/12 (!) 163/100   Wt Readings from Last 3 Encounters:  05/16/13 209 lb (94.8 kg)  02/16/12 215 lb 12.8 oz (97.9 kg)  01/25/12 226 lb (102.5 kg)      Physical Exam     Assessment & Plan     1. COVID-19 - Continue OTC symptomatic management of  choice - Will send OTC vitamins and supplement information through AVS - Patient enrolled in MyChart symptom monitoring - Push fluids - Rest as needed - Referral to Covid treatment team placed, consult appreciated - Discussed return precautions and when to seek in-person evaluation, sent via AVS as well - Ambulatory referral for Covid Treatment - MyChart COVID-19 home monitoring program; Future - Temperature monitoring; Future   No follow-ups on file.     I discussed the assessment and treatment plan with the patient. The patient was provided an opportunity to ask questions and all were answered. The patient agreed with the plan and demonstrated an understanding of the instructions.   The patient was advised to call back or seek an in-person evaluation if the symptoms worsen or if  the condition fails to improve as anticipated.  I provided 15 minutes of face-to-face time during this encounter via MyChart Video enabled encounter.   [place]ye [place] Slick [phone] (phone) [phone] (fax)  [place]

## 2020-06-10 ENCOUNTER — Telehealth: Payer: Self-pay

## 2020-06-10 NOTE — Telephone Encounter (Signed)
Called to discuss with patient about COVID-19 symptoms and the use of one of the available treatments for those with mild to moderate Covid symptoms and at a high risk of hospitalization.  Pt appears to qualify for outpatient treatment due to co-morbid conditions and/or a member of an at-risk group in accordance with the FDA Emergency Use Authorization.    Symptom onset: Unknown Vaccinated: Unknown Booster? Unknown Immunocompromised? No Qualifiers: HTN NIH Criteria: Tier 1  Unable to reach pt - Left message and call back number [phone].   Stacie Nunez

## 2020-07-30 ENCOUNTER — Ambulatory Visit (INDEPENDENT_AMBULATORY_CARE_PROVIDER_SITE_OTHER): Payer: 59 | Admitting: Obstetrics & Gynecology

## 2020-07-30 ENCOUNTER — Telehealth: Payer: Self-pay | Admitting: *Deleted

## 2020-07-30 ENCOUNTER — Encounter: Payer: Self-pay | Admitting: Obstetrics & Gynecology

## 2020-07-30 ENCOUNTER — Other Ambulatory Visit (HOSPITAL_COMMUNITY)
Admission: RE | Admit: 2020-07-30 | Discharge: 2020-07-30 | Disposition: A | Discharge status: Home / Self Care | Payer: 59 | Source: Ambulatory Visit | Attending: Obstetrics & Gynecology | Admitting: Obstetrics & Gynecology

## 2020-07-30 ENCOUNTER — Other Ambulatory Visit: Payer: Self-pay

## 2020-07-30 VITALS — BP 130/80 | Ht 66.0 in | Wt 211.0 lb

## 2020-07-30 DIAGNOSIS — Z01419 Encounter for gynecological examination (general) (routine) without abnormal findings: Secondary | ICD-10-CM | POA: Insufficient documentation

## 2020-07-30 DIAGNOSIS — I879 Disorder of vein, unspecified: Secondary | ICD-10-CM | POA: Diagnosis not present

## 2020-07-30 DIAGNOSIS — N914 Secondary oligomenorrhea: Secondary | ICD-10-CM

## 2020-07-30 NOTE — Telephone Encounter (Signed)
Order placed at [place] with [facility] office they will to schedule.

## 2020-07-30 NOTE — Progress Notes (Signed)
Stacie Nunez 1965-11-02 [id number]   History:    55 y.o. G2P0A2 Married.  Teaches Yoga.  RP:  New (>3 yrs) patient presenting for annual gyn exam   HPI: Oligomenorrhea.  Light bleeding a few months apart.  Occasional hot flushes and night sweats. No pelvic pain.  Mild dryness with IC.  Urine/BMs normal.  Breasts normal.  BMI 34.04.  Very fit.  [place] 2021.  Health labs here today.  Past medical history,surgical history, family history and social history were all reviewed and documented in the EPIC chart.  Gynecologic History Patient's last menstrual period was 02/16/2012.  Obstetric History OB History  Gravida Para Term Preterm AB Living  2 0 0 0 2 0  SAB IAB Ectopic Multiple Live Births               # Outcome Date GA Lbr Len/2nd Weight Sex Delivery Anes PTL Lv  2 AB           1 AB              ROS: A ROS was performed and pertinent positives and negatives are included in the history.  GENERAL: No fevers or chills. HEENT: No change in vision, no earache, sore throat or sinus congestion. NECK: No pain or stiffness. CARDIOVASCULAR: No chest pain or pressure. No palpitations. PULMONARY: No shortness of breath, cough or wheeze. GASTROINTESTINAL: No abdominal pain, nausea, vomiting or diarrhea, melena or bright red blood per rectum. GENITOURINARY: No urinary frequency, urgency, hesitancy or dysuria. MUSCULOSKELETAL: No joint or muscle pain, no back pain, no recent trauma. DERMATOLOGIC: No rash, no itching, no lesions. ENDOCRINE: No polyuria, polydipsia, no heat or cold intolerance. No recent change in weight. HEMATOLOGICAL: No anemia or easy bruising or bleeding. NEUROLOGIC: No headache, seizures, numbness, tingling or weakness. PSYCHIATRIC: No depression, no loss of interest in normal activity or change in sleep pattern.     Exam:   BP 130/80   Ht '5\' 6"'  (1.676 m)   Wt 211 lb (95.7 kg)   LMP 02/16/2012   BMI 34.06 kg/m   Body mass index is 34.06 kg/m.  General  appearance : Well developed well nourished female. No acute distress HEENT: Eyes: no retinal hemorrhage or exudates,  Neck supple, trachea midline, no carotid bruits, no thyroidmegaly Lungs: Clear to auscultation, no rhonchi or wheezes, or rib retractions  Heart: Regular rate and rhythm, no murmurs or gallops Breast:Examined in sitting and supine position were symmetrical in appearance, no palpable masses or tenderness,  no skin retraction, no nipple inversion, no nipple discharge, no skin discoloration, no axillary or supraclavicular lymphadenopathy Abdomen: no palpable masses or tenderness, no rebound or guarding Extremities: no edema or skin discoloration or tenderness  Pelvic: Vulva: Normal             Vagina: No gross lesions or discharge  Cervix: No gross lesions or discharge.  Pap reflex done.  Uterus  AV, normal size, shape and consistency, non-tender and mobile  Adnexa  Without masses or tenderness  Anus: Normal   Assessment/Plan:  55 y.o. female for annual exam   1. Encounter for routine gynecological examination with Papanicolaou smear of cervix Normal gynecologic exam.  Pap reflex done.  Breast exam normal.  Screening mammogram March 2022 was negative.  Colonoscopy 2021.  Body mass index 34.06.  Excellent fitness and good muscle mass.  Fasting health labs here today. - TSH - Lipid Profile - Vitamin D 1,25 dihydroxy - CBC -  Comp Met (CMET) - Cytology - PAP( [place])  2. Secondary oligomenorrhea Probably perimenopausal.  R/O PMB.  Will check an [facility] today for menopausal status.  F/U Pelvic US to r/o Endometrial pathology. - FSH - US Transvaginal Non-OB; Future  3. Dilation of the left jugular vein Dilation of left jugular vein.  Refer to Cardio with Dr Einar Gip for investigation and management.  Other orders - Multiple Vitamin (MULTIVITAMIN) tablet; Take 1 tablet by mouth daily. - VITAMIN D PO; Take by mouth. - Iodine, Kelp, (KELP PO); Take by mouth. - MAGNESIUM PO;  Take by mouth.   [name], 12:08 PM 07/30/2020

## 2020-07-30 NOTE — Telephone Encounter (Signed)
-----   Message from Princess Bruins, MD sent at 07/30/2020 12:37 PM EDT ----- Regarding: Refer to Cardio Dr Einar Gip Dilation of the left Jugular vein.  Investigation and management.

## 2020-07-31 LAB — CYTOLOGY - PAP: Diagnosis: NEGATIVE

## 2020-08-01 NOTE — Telephone Encounter (Signed)
Patient scheduled on 09/03/20

## 2020-08-04 LAB — LIPID PANEL
Cholesterol: 227 mg/dL — ABNORMAL HIGH (ref <200)
HDL: 64 mg/dL (ref >=50)
LDL Cholesterol (Calc): 144 mg/dL (calc) — ABNORMAL HIGH
Non-HDL Cholesterol (Calc): 163 mg/dL (calc) — ABNORMAL HIGH (ref <130)
Total CHOL/HDL Ratio: 3.5 (calc) (ref <5.0)
Triglycerides: 83 mg/dL (ref <150)

## 2020-08-04 LAB — VITAMIN D 1,25 DIHYDROXY
Vitamin D 1, 25 (OH)2 Total: 55 pg/mL (ref 18–72)
Vitamin D2 1, 25 (OH)2: <8 pg/mL
Vitamin D3 1, 25 (OH)2: 55 pg/mL

## 2020-08-04 LAB — CBC
HCT: 45.1 % — ABNORMAL HIGH (ref 35.0–45.0)
Hemoglobin: 15.1 g/dL (ref 11.7–15.5)
MCH: 31.2 pg (ref 27.0–33.0)
MCHC: 33.5 g/dL (ref 32.0–36.0)
MCV: 93.2 fL (ref 80.0–100.0)
MPV: 10.5 fL (ref 7.5–12.5)
Platelets: 230 10*3/uL (ref 140–400)
RBC: 4.84 10*6/uL (ref 3.80–5.10)
RDW: 12.6 % (ref 11.0–15.0)
WBC: 5.1 10*3/uL (ref 3.8–10.8)

## 2020-08-04 LAB — COMPREHENSIVE METABOLIC PANEL
AG Ratio: 2 (calc) (ref 1.0–2.5)
ALT: 19 U/L (ref 6–29)
AST: 24 U/L (ref 10–35)
Albumin: 4.5 g/dL (ref 3.6–5.1)
Alkaline phosphatase (APISO): 57 U/L (ref 37–153)
BUN: 17 mg/dL (ref 7–25)
CO2: 25 mmol/L (ref 20–32)
Calcium: 9.3 mg/dL (ref 8.6–10.4)
Chloride: 102 mmol/L (ref 98–110)
Creat: 0.71 mg/dL (ref 0.50–1.05)
Globulin: 2.2 g/dL (calc) (ref 1.9–3.7)
Glucose, Bld: 87 mg/dL (ref 65–99)
Potassium: 4.9 mmol/L (ref 3.5–5.3)
Sodium: 137 mmol/L (ref 135–146)
Total Bilirubin: 0.6 mg/dL (ref 0.2–1.2)
Total Protein: 6.7 g/dL (ref 6.1–8.1)

## 2020-08-04 LAB — FOLLICLE STIMULATING HORMONE: FSH: 30.6 m[IU]/mL

## 2020-08-04 LAB — TSH: TSH: 1.38 mIU/L (ref 0.40–4.50)

## 2020-09-02 ENCOUNTER — Ambulatory Visit: Payer: 59 | Admitting: Physician Assistant

## 2020-09-03 ENCOUNTER — Encounter: Payer: Self-pay | Admitting: Cardiology

## 2020-09-03 ENCOUNTER — Other Ambulatory Visit: Payer: Self-pay

## 2020-09-03 ENCOUNTER — Ambulatory Visit: Payer: 59 | Admitting: Cardiology

## 2020-09-03 VITALS — BP 122/74 | HR 72 | Temp 97.9°F | Resp 17 | Ht 66.0 in | Wt 216.2 lb

## 2020-09-03 DIAGNOSIS — Z6834 Body mass index (BMI) 34.0-34.9, adult: Secondary | ICD-10-CM

## 2020-09-03 DIAGNOSIS — R012 Other cardiac sounds: Secondary | ICD-10-CM

## 2020-09-03 DIAGNOSIS — E6609 Other obesity due to excess calories: Secondary | ICD-10-CM

## 2020-09-03 DIAGNOSIS — E7800 Pure hypercholesterolemia, unspecified: Secondary | ICD-10-CM

## 2020-09-03 NOTE — Progress Notes (Signed)
Primary Physician/Referring:  Patient, No Pcp Per (Inactive)  Patient ID: Stacie Nunez, female    DOB: Mar 07, 1965, 55 y.o.   MRN: [id number]  Chief Complaint  Patient presents with   New Patient (Initial Visit)   Disorder of the vein     Ref by Sebastian Ache   HPI:    Stacie Nunez  is a 55 y.o. Caucasian female who is fairly athletic, gets at least 20 to 25 miles of walking every week, a mile of swimming at least 2-3 times a week, does yoga and teaches yoga, asymptomatic without any chest pain, dyspnea or palpitation referred to me for evaluation of neck vein engorgement.  She is asymptomatic, has occasional sharp pains scattered in her chest, left arm, right arm, unrelated to exercise or any meaningful activity lasting a few seconds.  Past Medical History:  Diagnosis Date   Atypical mole 03/25/2020   mod-left forearm-anterior -    Basal cell carcinoma 03/25/2020   nod-right frontal scalp - MOHS   Dysrhythmia 2014   Irregular HR due to hormonal changes   Fibroids    Hypertension 2014   brief episode of HTN due to Lupron- no meds currently   Past Surgical History:  Procedure Laterality Date   DILATION AND CURETTAGE OF UTERUS N/A 05/22/2013   Procedure: DILATATION AND CURETTAGE;  Surgeon: Princess Bruins, MD;  Location: [facility];  Service: Gynecology;  Laterality: N/A;   LIPOMA EXCISION Right 05/22/2013   Procedure: EXCISION LIPOMA;  Surgeon: Princess Bruins, MD;  Location: [facility];  Service: Gynecology;  Laterality: Right;  right upper inner thigh   MYOMECTOMY N/A 05/22/2013   Procedure: VAGINAL MYOMECTOMY;  Surgeon: Princess Bruins, MD;  Location: [facility];  Service: Gynecology;  Laterality: N/A;  1 hr.   WISDOM TOOTH EXTRACTION     Family History  Problem Relation Age of Onset   Hypertension Mother 19   Hypertension Father    COPD Father 13   Diabetes Paternal Grandmother     Social History   Tobacco Use   Smoking status: Never   Smokeless tobacco:  Never  Substance Use Topics   Alcohol use: Yes    Alcohol/week: 2.0 standard drinks    Types: 2 Standard drinks or equivalent per week    Comment: Occas   Marital Status: Married  ROS  Review of Systems  Cardiovascular:  Negative for chest pain, dyspnea on exertion and leg swelling.  Gastrointestinal:  Negative for melena.  Objective  Blood pressure 122/74, pulse 72, temperature 97.9 F (36.6 C), temperature source Temporal, resp. rate 17, height '5\' 6"'$  (1.676 m), weight 216 lb 3.2 oz (98.1 kg), last menstrual period 02/16/2012, SpO2 98 %. Body mass index is 34.9 kg/m.  Vitals with BMI 09/03/2020 07/30/2020 05/22/2013  Height '5\' 6"'$  '5\' 6"'$  -  Weight 216 lbs 3 oz 211 lbs -  BMI 0000000 0000000 -  Systolic [id number]  Diastolic 74 80 76  Pulse 72 - 69    Physical Exam Constitutional:      Appearance: She is obese.     Comments: Muscular  Neck:     Vascular: No carotid bruit or JVD.     Comments: There is mild engorgement of the left external jugular vein with prominent valve Cardiovascular:     Rate and Rhythm: Normal rate and regular rhythm.     Pulses: Intact distal pulses.     Heart sounds: A midsystolic click (Ejection click left upper sternal  border). Murmur heard.  Early systolic murmur is present with a grade of 1/6 at the upper left sternal border.    No gallop.  Pulmonary:     Effort: Pulmonary effort is normal.     Breath sounds: Normal breath sounds.  Abdominal:     General: Bowel sounds are normal.     Palpations: Abdomen is soft.  Musculoskeletal:        General: No swelling.     Laboratory examination:   Recent Labs    07/30/20 1231  NA 137  K 4.9  CL 102  CO2 25  GLUCOSE 87  BUN 17  CREATININE 0.71  CALCIUM 9.3   CrCl cannot be calculated (Patient's most recent lab result is older than the maximum 21 days allowed.).  CMP Latest Ref Rng & Units 07/30/2020 02/16/2012 02/15/2012  Glucose 65 - 99 mg/dL 87 - -  BUN 7 - 25 mg/dL 17 - 10  Creatinine 0.50 -  1.05 mg/dL 0.71 - 0.8  Sodium 135 - 146 mmol/L 137 - 139  Potassium 3.5 - 5.3 mmol/L 4.9 - 4.7  Chloride 98 - 110 mmol/L 102 - -  CO2 20 - 32 mmol/L 25 - -  Calcium 8.6 - 10.4 mg/dL 9.3 - -  Total Protein 6.1 - 8.1 g/dL 6.7 - -  Total Bilirubin 0.2 - 1.2 mg/dL 0.6 - -  Alkaline Phos 25 - 125 U/L - 54 -  AST 10 - 35 U/L 24 17 -  ALT 6 - 29 U/L 19 18 -   CBC Latest Ref Rng & Units 07/30/2020 05/22/2013 02/15/2012  WBC 3.8 - 10.8 Thousand/uL 5.1 5.2 5.3  Hemoglobin 11.7 - 15.5 g/dL 15.1 11.7(L) 9.3(A)  Hematocrit 35.0 - 45.0 % 45.1(H) 37.2 28(A)  Platelets 140 - 400 Thousand/uL 230 303 466(A)    Lipid Panel Recent Labs    07/30/20 1231  CHOL 227*  TRIG 83  LDLCALC 144*  HDL 64  CHOLHDL 3.5   Lipid Panel     Component Value Date/Time   CHOL 227 (H) 07/30/2020 1231   TRIG 83 07/30/2020 1231   HDL 64 07/30/2020 1231   CHOLHDL 3.5 07/30/2020 1231   LDLCALC 144 (H) 07/30/2020 1231     HEMOGLOBIN A1C No results found for: HGBA1C, MPG TSH Recent Labs    07/30/20 1231  TSH 1.38   Medications and allergies  No Known Allergies   Medication prior to this encounter:   Outpatient Medications Prior to Visit  Medication Sig Dispense Refill   Cholecalciferol (VITAMIN D-3 PO) Take 5,000 capsules by mouth daily. 2 daily for 6 weeks, on Sunday she takes 5     Iodine, Kelp, (KELP PO) Take by mouth.     MAGNESIUM PO Take by mouth.     Multiple Vitamin (MULTIVITAMIN) tablet Take 1 tablet by mouth daily.     Red Yeast Rice Extract (RED YEAST RICE PO) Take 1,200 mg by mouth daily. 2 capsules daily     TURMERIC PO Take 2 capsules by mouth daily.     ibuprofen (ADVIL,MOTRIN) 200 MG tablet Take 400 mg by mouth every 6 (six) hours as needed. pain     amoxicillin (AMOXIL) 500 MG tablet Take 500 mg by mouth 3 (three) times daily.     ascorbic acid (VITAMIN C) 500 MG tablet Take 1,000 mg by mouth daily.     Chaste Tree (VITEX EXTRACT PO) Take 2 tablets by mouth daily. Vitex Gwenlyn Found (Patient  not taking: Reported  on 07/30/2020)     VITAMIN D PO Take by mouth.     No facility-administered medications prior to visit.     Medication list after today's encounter   Current Outpatient Medications  Medication Instructions   Cholecalciferol (VITAMIN D-3 PO) 5,000 capsules, Oral, Daily, 2 daily for 6 weeks, on Sunday she takes 5   Iodine, Kelp, (KELP PO) Oral   MAGNESIUM PO Oral   Multiple Vitamin (MULTIVITAMIN) tablet 1 tablet, Oral, Daily   Red Yeast Rice Extract (RED YEAST RICE PO) 1,200 mg, Oral, Daily, 2 capsules daily   TURMERIC PO 2 capsules, Oral, Daily    Radiology:   No results found.  Cardiac Studies:   Echo- 09/21/12 1.Left ventricular cavity is normal in size.   Normal global wall motion.   Normal systolic global function.   Calculated EF 55%. 2.Left atrial cavity is slightly dilated. 3.Mitral valve structurally normal.   Trace mitral regurgitation.    4.Tricuspid valve structurally normal.   Trace tricuspid regurgitation.  Stress EKG 05/29/12: The patient exercised according to the [facility]   protocol, Total time recorded 7 Min. 1 sec.achieving a max heart rate of 163  which was  94% of MPHR for age and  8.2 METS of work. Baseline NIBP was 110/74. Peak NIBP was 130/71. The baseline ECG showed NSR,Normal ECG. During exercise there was No ST segment depressions in the  leads, No ST elevations in the  leads Symptoms: Fatigue. Arrhythmia: None. . Indications: Assessment of Chest Pain. Conclusions: Negative for ischemia. Continue primary prevention. Normal BP response. Symptoms improved with PPI.  EKG:   EKG 09/03/2020: Normal sinus rhythm at rate of 62 bpm, normal axis.  No evidence of ischemia, normal EKG.  Assessment     ICD-10-CM   1. Abnormal heart sounds  R01.2 EKG 12-Lead    PCV ECHOCARDIOGRAM COMPLETE    2. Hypercholesteremia  E78.00 CT CARDIAC SCORING ([facility] LOCATIONS ONLY)    3. Class 1 obesity due to excess calories without serious comorbidity with body  mass index (BMI) of 34.0 to 34.9 in adult  E66.09 CT CARDIAC SCORING ([facility] LOCATIONS ONLY)   Z68.34        Medications Discontinued During This Encounter  Medication Reason   amoxicillin (AMOXIL) 500 MG tablet Error   VITAMIN D PO Error   ascorbic acid (VITAMIN C) 500 MG tablet Error   Chaste Tree (VITEX EXTRACT PO) Error   ibuprofen (ADVIL,MOTRIN) 200 MG tablet Discontinued by provider    No orders of the defined types were placed in this encounter.  Orders Placed This Encounter  Procedures   CT CARDIAC SCORING ([facility] LOCATIONS ONLY)    Standing Status:   Future    Standing Expiration Date:   11/03/2020    Order Specific Question:   Is patient pregnant?    Answer:   No    Order Specific Question:   Preferred imaging location?    Answer:   [facility]   EKG 12-Lead   PCV ECHOCARDIOGRAM COMPLETE    Standing Status:   Future    Standing Expiration Date:   09/03/2021   Recommendations:   Stacie Nunez is a 55 y.o. Caucasian female who is fairly athletic, gets at least 20 to 25 miles of walking every week, a mile of swimming at least 2-3 times a week, does yoga and teaches yoga, asymptomatic without any chest pain, dyspnea or palpitation referred to me for evaluation of neck vein engorgement.  The venous engorgement seen  on her left side of her neck is external jugular vein and a prominent valve.  There is no intrajugular vein engorgement or distention.  This is just a normal variant.  With regard to cardiac examination, she has an ejection click in the pulmonary area, would like to exclude presence of mild pulmonary stenosis or ASD.  We will obtain an echocardiogram.  She does have hyperlipidemia and has been taking red yeast rice.  Advised her to discontinue taking ibuprofen on a regular basis that she takes after workout.  She can try Tylenol for as needed use.  For further cardiac risk stratification, will obtain coronary calcium score, if score is elevated, I will have a low  threshold to start her on a statin therapy.  For now she will continue red yeast rice in the evening.  Office visit in 6 weeks for follow-up.  External labs reviewed.    Adrian Prows, MD, [facility] 09/03/2020, 12:18 PM Office: [phone]

## 2020-09-16 ENCOUNTER — Other Ambulatory Visit: Payer: 59

## 2020-09-24 ENCOUNTER — Ambulatory Visit
Admission: RE | Admit: 2020-09-24 | Discharge: 2020-09-24 | Disposition: A | Discharge status: Home / Self Care | Payer: 59 | Source: Ambulatory Visit | Attending: Cardiology | Admitting: Cardiology

## 2020-09-24 DIAGNOSIS — Z6834 Body mass index (BMI) 34.0-34.9, adult: Secondary | ICD-10-CM

## 2020-09-24 DIAGNOSIS — E7800 Pure hypercholesterolemia, unspecified: Secondary | ICD-10-CM

## 2020-09-24 DIAGNOSIS — E6609 Other obesity due to excess calories: Secondary | ICD-10-CM

## 2020-09-25 ENCOUNTER — Other Ambulatory Visit: Payer: 59 | Admitting: Obstetrics & Gynecology

## 2020-09-25 ENCOUNTER — Telehealth: Payer: Self-pay | Admitting: *Deleted

## 2020-09-25 ENCOUNTER — Other Ambulatory Visit: Payer: 59

## 2020-09-25 NOTE — Telephone Encounter (Signed)
Just FYI  Patient canceled pelvic ultrasound that was scheduled for today. Patient said she is not able to afford the cost of ultrasound. She not willing to proceed since her [facility] shows peri-menopausal and she has all the symptoms confirming this.

## 2020-09-26 NOTE — Telephone Encounter (Signed)
Left message for patient to call.

## 2020-09-26 NOTE — Telephone Encounter (Signed)
Ok, then present for visit with me if abnormal bleeding.

## 2020-09-26 NOTE — Telephone Encounter (Addendum)
Patient informed with below note. Patient said she does want to have the ultrasound still, she just needs to know what the cost may be before coming in the office. I told her I would send a message to Abigail Butts who checks benefits,reports when she was here the office still had no heard back from insurance on 09/25/20.

## 2020-09-29 ENCOUNTER — Other Ambulatory Visit: Payer: Self-pay

## 2020-09-29 ENCOUNTER — Ambulatory Visit: Payer: 59

## 2020-09-29 DIAGNOSIS — R012 Other cardiac sounds: Secondary | ICD-10-CM

## 2020-09-29 NOTE — Telephone Encounter (Signed)
Spoke with patient regarding benefits for recommended Ultrasound. Patient acknowledges understanding of information presented. Patient stated that she is going to be traveling and will call to reschedule when she returns.

## 2020-10-27 ENCOUNTER — Encounter: Payer: Self-pay | Admitting: Cardiology

## 2020-10-27 ENCOUNTER — Other Ambulatory Visit: Payer: Self-pay

## 2020-10-27 ENCOUNTER — Ambulatory Visit: Payer: 59 | Admitting: Cardiology

## 2020-10-27 VITALS — BP 137/81 | HR 67 | Temp 97.9°F | Resp 17 | Ht 66.0 in | Wt 211.6 lb

## 2020-10-27 DIAGNOSIS — E7800 Pure hypercholesterolemia, unspecified: Secondary | ICD-10-CM

## 2020-10-27 DIAGNOSIS — R931 Abnormal findings on diagnostic imaging of heart and coronary circulation: Secondary | ICD-10-CM

## 2020-10-27 MED ORDER — ROSUVASTATIN CALCIUM 20 MG PO TABS: 20.0000 mg | ORAL_TABLET | Freq: Every day | ORAL | 3 refills | Status: DC

## 2020-10-27 NOTE — Progress Notes (Signed)
Primary Physician/Referring:  Patient, No Pcp Per (Inactive)  Patient ID: Stacie Nunez, female    DOB: [birth year], 55 y.o.   MRN: [id number]  Chief Complaint  Patient presents with   Results    6 WEEKS   HPI:    Stacie Nunez  is a 55 y.o. Caucasian female who is fairly athletic, gets at least 20 to 25 miles of walking every week, a mile of swimming at least 2-3 times a week, does yoga and teaches yoga, asymptomatic without any chest pain, dyspnea or palpitation referred to me for evaluation of neck vein engorgement.  She is asymptomatic.  Past Medical History:  Diagnosis Date   Atypical mole 03/25/2020   mod-left forearm-anterior -    Basal cell carcinoma 03/25/2020   nod-right frontal scalp - MOHS   Dysrhythmia 2014   Irregular HR due to hormonal changes   Fibroids    Hypertension 2014   brief episode of HTN due to Lupron- no meds currently   Past Surgical History:  Procedure Laterality Date   DILATION AND CURETTAGE OF UTERUS N/A 05/22/2013   Procedure: DILATATION AND CURETTAGE;  Surgeon: Princess Bruins, MD;  Location: [facility];  Service: Gynecology;  Laterality: N/A;   LIPOMA EXCISION Right 05/22/2013   Procedure: EXCISION LIPOMA;  Surgeon: Princess Bruins, MD;  Location: [facility];  Service: Gynecology;  Laterality: Right;  right upper inner thigh   MYOMECTOMY N/A 05/22/2013   Procedure: VAGINAL MYOMECTOMY;  Surgeon: Princess Bruins, MD;  Location: [facility];  Service: Gynecology;  Laterality: N/A;  1 hr.   WISDOM TOOTH EXTRACTION     Family History  Problem Relation Age of Onset   Hypertension Mother 57   Hypertension Father    COPD Father 54   Diabetes Paternal Grandmother     Social History   Tobacco Use   Smoking status: Never   Smokeless tobacco: Never  Substance Use Topics   Alcohol use: Yes    Alcohol/week: 2.0 standard drinks    Types: 2 Standard drinks or equivalent per week    Comment: Occas   Marital Status: Married  ROS  Review of  Systems  Cardiovascular:  Negative for chest pain, dyspnea on exertion and leg swelling.  Gastrointestinal:  Negative for melena.  Objective  Blood pressure 137/81, pulse 67, temperature 97.9 F (36.6 C), temperature source Temporal, resp. rate 17, height 5\' 6"  (1.676 m), weight 211 lb 9.6 oz (96 kg), last menstrual period 02/16/2012, SpO2 99 %. Body mass index is 34.15 kg/m.  Vitals with BMI 10/27/2020 10/27/2020 09/03/2020  Height - 5\' 6"  5\' 6"   Weight - 211 lbs 10 oz 216 lbs 3 oz  BMI - 56.21 30.86  Systolic 578 469 629  Diastolic 81 85 74  Pulse 67 78 72    Physical Exam Constitutional:      Appearance: She is obese.     Comments: Muscular  Neck:     Vascular: No carotid bruit or JVD.     Comments: There is mild engorgement of the left external jugular vein with prominent valve Cardiovascular:     Rate and Rhythm: Normal rate and regular rhythm.     Pulses: Intact distal pulses.     Heart sounds: A midsystolic click (Ejection click left upper sternal border). Murmur heard.  Early systolic murmur is present with a grade of 1/6 at the upper left sternal border.    No gallop.  Pulmonary:     Effort: Pulmonary effort is  normal.     Breath sounds: Normal breath sounds.  Abdominal:     General: Bowel sounds are normal.     Palpations: Abdomen is soft.  Musculoskeletal:        General: No swelling.     Laboratory examination:   Recent Labs    07/30/20 1231  NA 137  K 4.9  CL 102  CO2 25  GLUCOSE 87  BUN 17  CREATININE 0.71  CALCIUM 9.3   CrCl cannot be calculated (Patient's most recent lab result is older than the maximum 21 days allowed.).  CMP Latest Ref Rng & Units 07/30/2020 02/16/2012 02/15/2012  Glucose 65 - 99 mg/dL 87 - -  BUN 7 - 25 mg/dL 17 - 10  Creatinine 0.50 - 1.05 mg/dL 0.71 - 0.8  Sodium 135 - 146 mmol/L 137 - 139  Potassium 3.5 - 5.3 mmol/L 4.9 - 4.7  Chloride 98 - 110 mmol/L 102 - -  CO2 20 - 32 mmol/L 25 - -  Calcium 8.6 - 10.4 mg/dL 9.3 - -   Total Protein 6.1 - 8.1 g/dL 6.7 - -  Total Bilirubin 0.2 - 1.2 mg/dL 0.6 - -  Alkaline Phos 25 - 125 U/L - 54 -  AST 10 - 35 U/L 24 17 -  ALT 6 - 29 U/L 19 18 -   CBC Latest Ref Rng & Units 07/30/2020 05/22/2013 02/15/2012  WBC 3.8 - 10.8 Thousand/uL 5.1 5.2 5.3  Hemoglobin 11.7 - 15.5 g/dL 15.1 11.7(L) 9.3(A)  Hematocrit 35.0 - 45.0 % 45.1(H) 37.2 28(A)  Platelets 140 - 400 Thousand/uL 230 303 466(A)    Lipid Panel Recent Labs    07/30/20 1231  CHOL 227*  TRIG 83  LDLCALC 144*  HDL 64  CHOLHDL 3.5   Lipid Panel     Component Value Date/Time   CHOL 227 (H) 07/30/2020 1231   TRIG 83 07/30/2020 1231   HDL 64 07/30/2020 1231   CHOLHDL 3.5 07/30/2020 1231   LDLCALC 144 (H) 07/30/2020 1231    HEMOGLOBIN A1C No results found for: HGBA1C, MPG TSH Recent Labs    07/30/20 1231  TSH 1.38   Medications and allergies  No Known Allergies   Medication prior to this encounter:   Outpatient Medications Prior to Visit  Medication Sig Dispense Refill   Cholecalciferol (VITAMIN D-3 PO) Take 5,000 capsules by mouth daily. 2 daily for 6 weeks, on Sunday she takes 5     Iodine, Kelp, (KELP PO) Take by mouth.     MAGNESIUM PO Take by mouth.     Multiple Vitamin (MULTIVITAMIN) tablet Take 1 tablet by mouth daily.     Plant Sterols and Stanols (CHOLESTOFF PLUS PO) Take 900 mg by mouth daily.     TURMERIC PO Take 2 capsules by mouth daily.     Red Yeast Rice Extract (RED YEAST RICE PO) Take 1,200 mg by mouth daily. 2 capsules daily     No facility-administered medications prior to visit.    Medication list after today's encounter   Current Outpatient Medications  Medication Instructions   Cholecalciferol (VITAMIN D-3 PO) 5,000 capsules, Oral, Daily, 2 daily for 6 weeks, on Sunday she takes 5   Iodine, Kelp, (KELP PO) Oral   MAGNESIUM PO Oral   Multiple Vitamin (MULTIVITAMIN) tablet 1 tablet, Oral, Daily   Plant Sterols and Stanols (CHOLESTOFF PLUS PO) 900 mg, Oral, Daily    rosuvastatin (CRESTOR) 20 mg, Oral, Daily   TURMERIC PO 2 capsules, Oral,  Daily   Radiology:   No results found.  Cardiac Studies:   PCV ECHOCARDIOGRAM COMPLETE 09/29/2020  Narrative Echocardiogram 09/29/2020: Left ventricle cavity is normal in size and wall thickness. Normal global wall motion. Normal LV systolic function with EF 61%. Normal diastolic filling pattern. Structurally normal trileaflet aortic valve. No evidence of aortic stenosis. Trace aortic regurgitation. Normal right atrial pressure. No pulmonic stenosis or ASD seen.  No significant change from 09/21/2012.    Coronary calcium score 09/24/2020: LM 0 LAD 11.1 LCx 0 RCA 95.4. Total Agatston score 106.5, MESA database percentile: 94 Thoracic ascending and descending aorta are normal in size.  No other significant extracardiac abnormalities.  EKG:   EKG 09/03/2020: Normal sinus rhythm at rate of 62 bpm, normal axis.  No evidence of ischemia, normal EKG.  Assessment     ICD-10-CM   1. Hypercholesteremia  E78.00 rosuvastatin (CRESTOR) 20 MG tablet    Lipid Panel With LDL/HDL Ratio    Lipid Panel With LDL/HDL Ratio    2. 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    Dispense:  90 tablet    Refill:  3    Orders Placed This Encounter  Procedures   Lipid Panel With LDL/HDL Ratio    Standing Status:   Future    Number of Occurrences:   1    Standing Expiration Date:   10/27/2021   Recommendations:   Stacie Nunez is a 55 y.o. Caucasian female who is fairly athletic, gets at least 20 to 25 miles of walking every week, a mile of swimming at least 2-3 times a week, does yoga  and teaches yoga, asymptomatic without any chest pain, dyspnea or palpitation referred to me for evaluation of neck vein engorgement.  I have reassured her with regard to her neck vein engorgement, she is athletic, and this is just a regular well.  She is very athletic, however she does have mild obesity, she could certainly improve upon her lipid current antibiotic.  In view of her markedly elevated coronary calcium score which is in the 90th percentile, I have recommended that she go on a statin, we will start her on Crestor 20 mg daily, she will obtain lipid profile testing in 3 months.  Her goal LDL is <70.  I had heard an ejection click, probably normal split of the heart sounds, echocardiogram is completely normal, echocardiogram also in 2014 was normal.  Hence I do not worry about any valvular heart disease as well.  I will see her back on a as needed basis, advised her to stay on statins for the rest of her life.   Adrian Prows, MD, [facility] 10/27/2020, 4:17 PM Office: [phone]

## 2020-12-03 ENCOUNTER — Ambulatory Visit: Payer: 59 | Admitting: Physician Assistant

## 2020-12-15 ENCOUNTER — Encounter: Payer: Self-pay | Admitting: Physician Assistant

## 2020-12-15 ENCOUNTER — Other Ambulatory Visit: Payer: Self-pay

## 2020-12-15 ENCOUNTER — Ambulatory Visit: Payer: 59 | Admitting: Physician Assistant

## 2020-12-15 DIAGNOSIS — L309 Dermatitis, unspecified: Secondary | ICD-10-CM | POA: Diagnosis not present

## 2020-12-15 DIAGNOSIS — Z86018 Personal history of other benign neoplasm: Secondary | ICD-10-CM | POA: Diagnosis not present

## 2020-12-15 DIAGNOSIS — Z85828 Personal history of other malignant neoplasm of skin: Secondary | ICD-10-CM

## 2020-12-15 MED ORDER — TRIAMCINOLONE ACETONIDE 0.1 % EX CREA: 1.0000 "application " | TOPICAL_CREAM | Freq: Every day | CUTANEOUS | 4 refills | Status: AC

## 2020-12-16 ENCOUNTER — Ambulatory Visit: Payer: 59 | Admitting: Physician Assistant

## 2020-12-17 ENCOUNTER — Encounter: Payer: Self-pay | Admitting: Physician Assistant

## 2020-12-17 NOTE — Progress Notes (Signed)
   Follow-Up Visit   Subjective  Stacie Nunez is a 55 y.o. female who presents for the following: Follow-up (Patient here today for follow up from [facility] on forehead and recheck left forearm. Per patient she would like to have the lesion on her abdomen rechecked it's just itching, bleeding with scratching. Personal history or atypical moles, and non mole skin cancer. ).   The following portions of the chart were reviewed this encounter and updated as appropriate:  Tobacco  Allergies  Meds  Problems  Med Hx  Surg Hx  Fam Hx      Objective  Well appearing patient in no apparent distress; mood and affect are within normal limits.  A full examination was performed including scalp, head, eyes, ears, nose, lips, neck, chest, axillae, abdomen, back, buttocks, bilateral upper extremities, bilateral lower extremities, hands, feet, fingers, toes, fingernails, and toenails. All findings within normal limits unless otherwise noted below.  Upper Mid Abdomen Hyperkeratotic pink plaques with evidence of excoriations.   Assessment & Plan  Eczema, unspecified type Upper Mid Abdomen  triamcinolone cream (KENALOG) 0.1 % - Upper Mid Abdomen Apply 1 application topically daily.  No atypical nevi noted at the time of the visit.  Mohs site of BCC removal was clear.  I, Mattheu Brodersen, PA-C, have reviewed all documentation's for this visit.  The documentation on 12/17/20 for the exam, diagnosis, procedures and orders are all accurate and complete.

## 2021-01-07 ENCOUNTER — Other Ambulatory Visit: Payer: Self-pay | Admitting: Cardiology

## 2021-01-07 ENCOUNTER — Encounter: Payer: Self-pay | Admitting: Cardiology

## 2021-01-07 ENCOUNTER — Telehealth: Payer: Self-pay

## 2021-01-07 DIAGNOSIS — R931 Abnormal findings on diagnostic imaging of heart and coronary circulation: Secondary | ICD-10-CM

## 2021-01-07 DIAGNOSIS — E7800 Pure hypercholesterolemia, unspecified: Secondary | ICD-10-CM

## 2021-01-07 NOTE — Telephone Encounter (Signed)
Pt called to say she is having muscle aches  from the crestor and would like to switch to Simvastatin. She hasn't taken crestor since Saturday and does feel a little better.

## 2021-01-07 NOTE — Telephone Encounter (Signed)
Yes she may and what dose. She may need Zetia with this to get desired effect

## 2021-01-07 NOTE — Progress Notes (Signed)
Patient called and stated she could not take Crestor, she is willing to go back to taking simvastatin.  Do not know the dose but will follow up on this.  Medications Discontinued During This Encounter  Medication Reason   rosuvastatin (CRESTOR) 20 MG tablet Side effect (s)

## 2021-01-08 NOTE — Telephone Encounter (Signed)
Lvm for pt to call back. 

## 2021-01-09 MED ORDER — ATORVASTATIN CALCIUM 40 MG PO TABS: 40.0000 mg | ORAL_TABLET | Freq: Every day | ORAL | 3 refills | Status: DC

## 2021-01-15 ENCOUNTER — Other Ambulatory Visit: Payer: 59

## 2021-01-15 ENCOUNTER — Other Ambulatory Visit: Payer: 59 | Admitting: Obstetrics & Gynecology

## 2021-07-22 ENCOUNTER — Other Ambulatory Visit: Payer: Self-pay | Admitting: Obstetrics & Gynecology

## 2021-07-22 DIAGNOSIS — Z1231 Encounter for screening mammogram for malignant neoplasm of breast: Secondary | ICD-10-CM

## 2021-08-10 ENCOUNTER — Telehealth: Payer: Self-pay

## 2021-08-10 NOTE — Telephone Encounter (Signed)
Patient called about trying to have her AEX labs drawn in the morning  at [facility] with her lipid panel ordered by Dr. Tamsen Snider. She also would like "codes" for all the lab tests.  I warned her that late in day and might be difficult to get all this handled in the 2 hours left today.  She asked about just having it drawn at her visit but doing it fasting and I told her that would be fine. She decided to just wait for her AEX visit with Dr. Marguerita Merles and do her GYN labs then.  She does want to schedule AEX and message was sent to appt desk to call her.

## 2021-08-11 ENCOUNTER — Telehealth: Payer: Self-pay | Admitting: Cardiology

## 2021-08-11 ENCOUNTER — Other Ambulatory Visit: Payer: Self-pay

## 2021-08-11 ENCOUNTER — Ambulatory Visit
Admission: RE | Admit: 2021-08-11 | Discharge: 2021-08-11 | Disposition: A | Discharge status: Home / Self Care | Payer: 59 | Source: Ambulatory Visit | Attending: Obstetrics & Gynecology | Admitting: Obstetrics & Gynecology

## 2021-08-11 DIAGNOSIS — E7800 Pure hypercholesterolemia, unspecified: Secondary | ICD-10-CM

## 2021-08-11 DIAGNOSIS — Z1231 Encounter for screening mammogram for malignant neoplasm of breast: Secondary | ICD-10-CM

## 2021-08-11 NOTE — Telephone Encounter (Signed)
Patient went to [facility] to get lab work done, says they didn't have orders. There are no orders in the system. Please put in the orders and release.

## 2021-08-12 ENCOUNTER — Other Ambulatory Visit: Payer: Self-pay | Admitting: Obstetrics & Gynecology

## 2021-08-12 DIAGNOSIS — R928 Other abnormal and inconclusive findings on diagnostic imaging of breast: Secondary | ICD-10-CM

## 2021-08-13 ENCOUNTER — Telehealth: Payer: Self-pay

## 2021-08-13 LAB — LIPID PANEL WITH LDL/HDL RATIO
Cholesterol, Total: 180 mg/dL (ref 100–199)
HDL: 54 mg/dL (ref >39)
LDL Chol Calc (NIH): 113 mg/dL — ABNORMAL HIGH (ref 0–99)
LDL/HDL Ratio: 2.1 ratio (ref 0.0–3.2)
Triglycerides: 67 mg/dL (ref 0–149)
VLDL Cholesterol Cal: 13 mg/dL (ref 5–40)

## 2021-08-13 NOTE — Progress Notes (Signed)
Needs elective OV. I hope she is taking her crestor daily. If she is then add Zetia 10 mg evening and send 90 day Rx and repeat lipid profile in 2 months and see me in 2.5 months

## 2021-08-13 NOTE — Telephone Encounter (Signed)
Patient called and stated that she received a "MyChart" message from you regarding her LDL and she is not understanding   MyChart message:  "Needs elective OV. I hope she is taking her crestor daily. If she is then add Zetia 10 mg evening and send 90 day Rx and repeat lipid profile in 2 months and see me in 2.5 months"  Patient was confused with message and wanted to know what an "elective" OV means and patient has been actually taking Lipitor, last changed October 2022 after she had some side effects and she stated that her numbers have improved since. Patient is agreeable to start Zetia '10mg'$ . I will send Rx to her pharmacy.  Please advise.

## 2021-08-13 NOTE — Telephone Encounter (Signed)
OV is office visit. I sent the message to you to call her and also the same message to her. Please ask if she is taking Crestor daily. If she is then add Zetia 10 mg evening and send 90 day Rx and repeat lipid profile in 2 months and see me in 2.5 months". If she is not, then advice her to take daily crestor and check in 6 months with [place] or PCP

## 2021-08-14 ENCOUNTER — Other Ambulatory Visit: Payer: Self-pay

## 2021-08-14 MED ORDER — EZETIMIBE 10 MG PO TABS: 10.0000 mg | ORAL_TABLET | Freq: Every day | ORAL | 3 refills | Status: DC

## 2021-08-14 NOTE — Telephone Encounter (Signed)
Zetia has sent to her pharmacy. Patient aware.

## 2021-08-18 NOTE — Progress Notes (Signed)
Called and spoke to pt, pt is out of town and will pick up new medications tomorrow. She stated she will give [place] a call when she is available to make an appt.

## 2021-08-19 ENCOUNTER — Other Ambulatory Visit: Payer: 59

## 2021-08-20 ENCOUNTER — Ambulatory Visit
Admission: RE | Admit: 2021-08-20 | Discharge: 2021-08-20 | Disposition: A | Discharge status: Home / Self Care | Payer: 59 | Source: Ambulatory Visit | Attending: Obstetrics & Gynecology | Admitting: Obstetrics & Gynecology

## 2021-08-20 ENCOUNTER — Other Ambulatory Visit: Payer: Self-pay | Admitting: Obstetrics & Gynecology

## 2021-08-20 DIAGNOSIS — R928 Other abnormal and inconclusive findings on diagnostic imaging of breast: Secondary | ICD-10-CM

## 2021-08-20 DIAGNOSIS — N6320 Unspecified lump in the left breast, unspecified quadrant: Secondary | ICD-10-CM

## 2021-08-25 ENCOUNTER — Telehealth: Payer: Self-pay

## 2021-08-25 NOTE — Telephone Encounter (Signed)
Pt called wanting to know if she needs any labs done prior to her appt with [place] in October. Please advise.

## 2021-08-28 ENCOUNTER — Encounter: Payer: Self-pay | Admitting: Cardiology

## 2021-08-28 DIAGNOSIS — E7800 Pure hypercholesterolemia, unspecified: Secondary | ICD-10-CM

## 2021-08-31 NOTE — Telephone Encounter (Signed)
ICD-10-CM   1. Hypercholesteremia  E78.00 Lipid Panel With LDL/HDL Ratio     Orders Placed This Encounter  Procedures   Lipid Panel With LDL/HDL Ratio

## 2021-08-31 NOTE — Telephone Encounter (Signed)
From patient.

## 2021-10-08 ENCOUNTER — Other Ambulatory Visit (HOSPITAL_COMMUNITY)
Admission: RE | Admit: 2021-10-08 | Discharge: 2021-10-08 | Disposition: A | Discharge status: Home / Self Care | Payer: 59 | Source: Ambulatory Visit | Attending: Obstetrics & Gynecology | Admitting: Obstetrics & Gynecology

## 2021-10-08 ENCOUNTER — Ambulatory Visit (INDEPENDENT_AMBULATORY_CARE_PROVIDER_SITE_OTHER): Payer: 59 | Admitting: Obstetrics & Gynecology

## 2021-10-08 ENCOUNTER — Encounter: Payer: Self-pay | Admitting: Obstetrics & Gynecology

## 2021-10-08 VITALS — BP 118/80 | HR 72 | Resp 16 | Ht 65.75 in | Wt 201.0 lb

## 2021-10-08 DIAGNOSIS — Z01419 Encounter for gynecological examination (general) (routine) without abnormal findings: Secondary | ICD-10-CM

## 2021-10-08 DIAGNOSIS — N926 Irregular menstruation, unspecified: Secondary | ICD-10-CM | POA: Diagnosis present

## 2021-10-08 DIAGNOSIS — N951 Menopausal and female climacteric states: Secondary | ICD-10-CM

## 2021-10-08 NOTE — Addendum Note (Signed)
Addended by: Princess Bruins on: 10/08/2021 04:46 PM   Modules accepted: Orders

## 2021-10-08 NOTE — Progress Notes (Addendum)
Stacie Nunez 1965/10/19 [id number]   History:    56 y.o. G2P0A2 Married.  Teaches Yoga.   RP:  Established patient presenting for annual gyn exam    HPI: Oligomenorrhea.  Light bleeding a few months apart.  Occasional hot flushes and night sweats. No pelvic pain.  Mild dryness with IC.  Pap Neg 07/2020.  Pap reflex today.  Urine/BMs normal. Breasts normal.  Screening mammo 08/2021 Rt Negative/Lt Dx mammo/US Probably Benign, repeat at 6 months.  BMI improved to 32.69.  Very fit.  [place] 2021.  F/U here for Fasting Health Labs.  Hypercholesterolemia followed and treated by Fam MD.   Past medical history,surgical history, family history and social history were all reviewed and documented in the EPIC chart.  Gynecologic History Patient's last menstrual period was 02/16/2012.  Obstetric History OB History  Gravida Para Term Preterm AB Living  2 0 0 0 2 0  SAB IAB Ectopic Multiple Live Births               # Outcome Date GA Lbr Len/2nd Weight Sex Delivery Anes PTL Lv  2 AB           1 AB              ROS: A ROS was performed and pertinent positives and negatives are included in the history. GENERAL: No fevers or chills. HEENT: No change in vision, no earache, sore throat or sinus congestion. NECK: No pain or stiffness. CARDIOVASCULAR: No chest pain or pressure. No palpitations. PULMONARY: No shortness of breath, cough or wheeze. GASTROINTESTINAL: No abdominal pain, nausea, vomiting or diarrhea, melena or bright red blood per rectum. GENITOURINARY: No urinary frequency, urgency, hesitancy or dysuria. MUSCULOSKELETAL: No joint or muscle pain, no back pain, no recent trauma. DERMATOLOGIC: No rash, no itching, no lesions. ENDOCRINE: No polyuria, polydipsia, no heat or cold intolerance. No recent change in weight. HEMATOLOGICAL: No anemia or easy bruising or bleeding. NEUROLOGIC: No headache, seizures, numbness, tingling or weakness. PSYCHIATRIC: No depression, no loss of interest in  normal activity or change in sleep pattern.     Exam:  BP 118/80   Pulse 72   Resp 16   Ht 5' 5.75" (1.67 m)   Wt 201 lb (91.2 kg)   LMP 02/16/2012 Comment: spotting maybe every couple months  BMI 32.69 kg/m   Body mass index is 32.69 kg/m.  General appearance : Well developed well nourished female. No acute distress HEENT: Eyes: no retinal hemorrhage or exudates,  Neck supple, trachea midline, no carotid bruits, no thyroidmegaly Lungs: Clear to auscultation, no rhonchi or wheezes, or rib retractions  Heart: Regular rate and rhythm, no murmurs or gallops Breast:Examined in sitting and supine position were symmetrical in appearance, no palpable masses or tenderness,  no skin retraction, no nipple inversion, no nipple discharge, no skin discoloration, no axillary or supraclavicular lymphadenopathy Abdomen: no palpable masses or tenderness, no rebound or guarding Extremities: no edema or skin discoloration or tenderness  Pelvic: Vulva: Normal             Vagina: No gross lesions or discharge  Cervix: No gross lesions or discharge.  Pap reflex done.  Uterus  AV, normal size, shape and consistency, non-tender and mobile  Adnexa  Without masses or tenderness  Anus: Normal   Assessment/Plan:  56 y.o. female for annual exam   1. Encounter for routine gynecological examination with Papanicolaou smear of cervix Oligomenorrhea.  Light bleeding a few months apart.  Occasional hot flushes and night sweats. No pelvic pain.  Mild dryness with IC.  Pap Neg 07/2020.  Pap reflex today.  Urine/BMs normal. Breasts normal.  Screening mammo 08/2021 Rt Negative/Lt Dx mammo/US Probably Benign, repeat at 6 months.  BMI improved to 32.69.  Very fit.  [place] 2021.  F/U here for Fasting Health Labs.  Hypercholesterolemia followed and treated by Fam MD. - TSH; Future - CBC; Future - Comp Met (CMET); Future - Cytology - PAP( [place])  2. Perimenopause Perimenopause with Oligomenorrhea or postmenopause  with PMB.  Will check [place] and f/u with a Pelvic US/possible EBx to further investigate. - [place]; Future  3. Irregular bleeding Perimenopause with Oligomenorrhea or postmenopause with PMB.  Will check [place] and f/u with a Pelvic US/possible EBx to further investigate. - US Transvaginal Non-OB; Future  [facility]  [name], 2:38 PM 10/08/2021

## 2021-10-13 ENCOUNTER — Other Ambulatory Visit: Payer: 59

## 2021-10-13 DIAGNOSIS — Z01419 Encounter for gynecological examination (general) (routine) without abnormal findings: Secondary | ICD-10-CM

## 2021-10-13 DIAGNOSIS — N951 Menopausal and female climacteric states: Secondary | ICD-10-CM

## 2021-10-14 LAB — CBC
HCT: 44.5 % (ref 35.0–45.0)
Hemoglobin: 14.7 g/dL (ref 11.7–15.5)
MCH: 30.9 pg (ref 27.0–33.0)
MCHC: 33 g/dL (ref 32.0–36.0)
MCV: 93.7 fL (ref 80.0–100.0)
MPV: 10.4 fL (ref 7.5–12.5)
Platelets: 215 10*3/uL (ref 140–400)
RBC: 4.75 10*6/uL (ref 3.80–5.10)
RDW: 12 % (ref 11.0–15.0)
WBC: 5.1 10*3/uL (ref 3.8–10.8)

## 2021-10-14 LAB — COMPREHENSIVE METABOLIC PANEL
AG Ratio: 2 (calc) (ref 1.0–2.5)
ALT: 29 U/L (ref 6–29)
AST: 21 U/L (ref 10–35)
Albumin: 4.2 g/dL (ref 3.6–5.1)
Alkaline phosphatase (APISO): 72 U/L (ref 37–153)
BUN: 20 mg/dL (ref 7–25)
CO2: 26 mmol/L (ref 20–32)
Calcium: 9.6 mg/dL (ref 8.6–10.4)
Chloride: 107 mmol/L (ref 98–110)
Creat: 0.7 mg/dL (ref 0.50–1.03)
Globulin: 2.1 g/dL (calc) (ref 1.9–3.7)
Glucose, Bld: 86 mg/dL (ref 65–99)
Potassium: 5.1 mmol/L (ref 3.5–5.3)
Sodium: 141 mmol/L (ref 135–146)
Total Bilirubin: 0.4 mg/dL (ref 0.2–1.2)
Total Protein: 6.3 g/dL (ref 6.1–8.1)

## 2021-10-14 LAB — CYTOLOGY - PAP: Diagnosis: NEGATIVE

## 2021-10-14 LAB — FOLLICLE STIMULATING HORMONE: FSH: 76.3 m[IU]/mL

## 2021-10-14 LAB — TSH: TSH: 1.37 mIU/L (ref 0.40–4.50)

## 2021-11-19 ENCOUNTER — Other Ambulatory Visit: Payer: 59

## 2021-11-19 ENCOUNTER — Other Ambulatory Visit: Payer: 59 | Admitting: Obstetrics & Gynecology

## 2021-11-24 LAB — LIPID PANEL WITH LDL/HDL RATIO
Cholesterol, Total: 146 mg/dL (ref 100–199)
HDL: 60 mg/dL (ref >39)
LDL Chol Calc (NIH): 74 mg/dL (ref 0–99)
LDL/HDL Ratio: 1.2 ratio (ref 0.0–3.2)
Triglycerides: 55 mg/dL (ref 0–149)
VLDL Cholesterol Cal: 12 mg/dL (ref 5–40)

## 2021-11-26 ENCOUNTER — Ambulatory Visit: Payer: 59 | Admitting: Cardiology

## 2021-11-26 ENCOUNTER — Encounter: Payer: Self-pay | Admitting: Cardiology

## 2021-11-26 VITALS — BP 130/84 | HR 72 | Temp 98.2°F | Resp 15 | Ht 65.75 in | Wt 206.4 lb

## 2021-11-26 DIAGNOSIS — E7800 Pure hypercholesterolemia, unspecified: Secondary | ICD-10-CM

## 2021-11-26 DIAGNOSIS — E6609 Other obesity due to excess calories: Secondary | ICD-10-CM

## 2021-11-26 DIAGNOSIS — R931 Abnormal findings on diagnostic imaging of heart and coronary circulation: Secondary | ICD-10-CM

## 2021-11-26 MED ORDER — SEMAGLUTIDE-WEIGHT MANAGEMENT 0.5 MG/0.5ML ~~LOC~~ SOAJ: 0.5000 mg | SUBCUTANEOUS | 0 refills | Status: DC

## 2021-11-26 MED ORDER — WEGOVY 0.25 MG/0.5ML ~~LOC~~ SOAJ: 0.2500 mg | SUBCUTANEOUS | 1 refills | Status: DC

## 2021-11-26 MED ORDER — SEMAGLUTIDE-WEIGHT MANAGEMENT 1 MG/0.5ML ~~LOC~~ SOAJ: 1.0000 mg | SUBCUTANEOUS | 1 refills | Status: DC

## 2021-11-26 MED ORDER — SEMAGLUTIDE(0.25 OR 0.5MG/DOS) 2 MG/1.5ML ~~LOC~~ SOPN
0.2500 mg | PEN_INJECTOR | Freq: Once | SUBCUTANEOUS | Status: DC
Start: 2021-11-26 — End: 2021-12-09

## 2021-11-26 NOTE — Progress Notes (Signed)
Primary Physician/Referring:  Patient, No Pcp Per  Patient ID: Stacie Nunez, female    DOB: 1965-05-15, 56 y.o.   MRN: [id number]  Chief Complaint  Patient presents with   Follow-up    1 month   HPI:    Stacie Nunez  is a 57 y.o. Caucasian female who is fairly athletic, gets at least 20 to 25 miles of walking every week, a mile of swimming at least 2-3 times a week, does yoga and teaches yoga, asymptomatic without any chest pain, dyspnea or palpitation. In view of her markedly elevated coronary calcium score which is in the 90th percentile, I will start her on Crestor 20 mg daily but also add Zetia and she now underwent lipid profile testing presents for follow-up. She is asymptomatic.  Past Medical History:  Diagnosis Date   Atypical mole 03/25/2020   mod-left forearm-anterior -    Basal cell carcinoma 03/25/2020   nod-right frontal scalp - MOHS   Dysrhythmia 2014   Irregular HR due to hormonal changes   Elevated cholesterol    Fibroids    Hypertension 2014   brief episode of HTN due to Lupron- no meds currently   Past Surgical History:  Procedure Laterality Date   DILATION AND CURETTAGE OF UTERUS N/A 05/22/2013   Procedure: DILATATION AND CURETTAGE;  Surgeon: Princess Bruins, MD;  Location: [facility];  Service: Gynecology;  Laterality: N/A;   LIPOMA EXCISION Right 05/22/2013   Procedure: EXCISION LIPOMA;  Surgeon: Princess Bruins, MD;  Location: [facility];  Service: Gynecology;  Laterality: Right;  right upper inner thigh   MYOMECTOMY N/A 05/22/2013   Procedure: VAGINAL MYOMECTOMY;  Surgeon: Princess Bruins, MD;  Location: [facility];  Service: Gynecology;  Laterality: N/A;  1 hr.   WISDOM TOOTH EXTRACTION     Family History  Problem Relation Age of Onset   Hyperlipidemia Mother    Hypertension Mother 43   Hyperlipidemia Father    Hypertension Father    COPD Father 39   Diabetes Paternal Grandmother     Social History   Tobacco Use   Smoking status: Never    Smokeless tobacco: Never  Substance Use Topics   Alcohol use: Yes    Alcohol/week: 2.0 standard drinks of alcohol    Types: 2 Standard drinks or equivalent per week   Marital Status: Married  ROS  Review of Systems  Cardiovascular:  Negative for chest pain, dyspnea on exertion and leg swelling.  Gastrointestinal:  Negative for melena.   Objective  Blood pressure 130/84, pulse 72, temperature 98.2 F (36.8 C), temperature source Temporal, resp. rate 15, height 5' 5.75" (1.67 m), weight 206 lb 6.4 oz (93.6 kg), SpO2 96 %. Body mass index is 33.57 kg/m.     11/26/2021    2:34 PM 10/08/2021    2:01 PM 10/27/2020    3:44 PM  Vitals with BMI  Height 5' 5.75" 5' 5.75"   Weight 206 lbs 6 oz 201 lbs   BMI 03.47 42.59   Systolic 563 875 643  Diastolic 84 80 81  Pulse 72 72 67    Physical Exam Constitutional:      Appearance: She is obese.     Comments: Muscular  Neck:     Vascular: No carotid bruit or JVD.  Cardiovascular:     Rate and Rhythm: Normal rate and regular rhythm.     Pulses: Intact distal pulses.     Heart sounds: A midsystolic click (Ejection click left upper  sternal border). Murmur heard.     Early systolic murmur is present with a grade of 1/6 at the upper left sternal border.     No gallop.  Pulmonary:     Effort: Pulmonary effort is normal.     Breath sounds: Normal breath sounds.  Abdominal:     General: Bowel sounds are normal.     Palpations: Abdomen is soft.  Musculoskeletal:        General: No swelling.     Laboratory examination:   Recent Labs    10/13/21 0901  NA 141  K 5.1  CL 107  CO2 26  GLUCOSE 86  BUN 20  CREATININE 0.70  CALCIUM 9.6   CrCl cannot be calculated (Patient's most recent lab result is older than the maximum 21 days allowed.).     Latest Ref Rng & Units 10/13/2021    9:01 AM 07/30/2020   12:31 PM 02/16/2012   12:00 AM  CMP  Glucose 65 - 99 mg/dL 86  87    BUN 7 - 25 mg/dL 20  17    Creatinine 0.50 - 1.03 mg/dL 0.70   0.71    Sodium 135 - 146 mmol/L 141  137    Potassium 3.5 - 5.3 mmol/L 5.1  4.9    Chloride 98 - 110 mmol/L 107  102    CO2 20 - 32 mmol/L 26  25    Calcium 8.6 - 10.4 mg/dL 9.6  9.3    Total Protein 6.1 - 8.1 g/dL 6.3  6.7    Total Bilirubin 0.2 - 1.2 mg/dL 0.4  0.6    Alkaline Phos 25 - 125 U/L   54   AST 10 - 35 U/L '21  24  17   '$ ALT 6 - 29 U/L '29  19  18       '$ Latest Ref Rng & Units 10/13/2021    9:01 AM 07/30/2020   12:31 PM 05/22/2013    1:25 PM  CBC  WBC 3.8 - 10.8 Thousand/uL 5.1  5.1  5.2   Hemoglobin 11.7 - 15.5 g/dL 14.7  15.1  11.7   Hematocrit 35.0 - 45.0 % 44.5  45.1  37.2   Platelets 140 - 400 Thousand/uL 215  230  303     Lipid Panel Recent Labs    08/12/21 0807 11/23/21 0933  CHOL 180 146  TRIG 67 55  LDLCALC 113* 74  HDL 54 60   Lipid Panel     Component Value Date/Time   CHOL 146 11/23/2021 0933   TRIG 55 11/23/2021 0933   HDL 60 11/23/2021 0933   CHOLHDL 3.5 07/30/2020 1231   LDLCALC 74 11/23/2021 0933   LDLCALC 144 (H) 07/30/2020 1231   LABVLDL 12 11/23/2021 0933    HEMOGLOBIN A1C No results found for: "HGBA1C", "MPG" TSH Recent Labs    10/13/21 0901  TSH 1.37   Medications and allergies   Allergies  Allergen Reactions   Crestor [Rosuvastatin] Other (See Comments)    Myalgia     Medication    Current Outpatient Medications:    atorvastatin (LIPITOR) 40 MG tablet, Take 1 tablet (40 mg total) by mouth daily., Disp: 90 tablet, Rfl: 3   Cholecalciferol (VITAMIN D-3 PO), Take 5,000 capsules by mouth. Sunday she takes 5, Disp: , Rfl:    ezetimibe (ZETIA) 10 MG tablet, Take 1 tablet (10 mg total) by mouth daily., Disp: 90 tablet, Rfl: 3   Iodine, Kelp, (KELP PO), Take by  mouth., Disp: , Rfl:    MAGNESIUM PO, Take by mouth., Disp: , Rfl:    Multiple Vitamin (MULTIVITAMIN) tablet, Take 1 tablet by mouth daily., Disp: , Rfl:    triamcinolone cream (KENALOG) 0.1 %, Apply 1 application topically daily., Disp: 454 g, Rfl: 4   [START ON  12/25/2021] Semaglutide-Weight Management 0.5 MG/0.5ML SOAJ, Inject 0.5 mg into the skin once a week for 28 days., Disp: 2 mL, Rfl: 0   [START ON 01/23/2022] Semaglutide-Weight Management 1 MG/0.5ML SOAJ, Inject 1 mg into the skin once a week., Disp: 2 mL, Rfl: 1  Current Facility-Administered Medications:    Semaglutide(0.25 or 0.'5MG'$ /DOS) SOPN 0.25 mg, 0.25 mg, Subcutaneous, Once, Adrian Prows, MD  Radiology:   No results found.  Cardiac Studies:   PCV ECHOCARDIOGRAM COMPLETE 09/29/2020  Narrative Echocardiogram 09/29/2020: Left ventricle cavity is normal in size and wall thickness. Normal global wall motion. Normal LV systolic function with EF 61%. Normal diastolic filling pattern. Structurally normal trileaflet aortic valve. No evidence of aortic stenosis. Trace aortic regurgitation. Normal right atrial pressure. No pulmonic stenosis or ASD seen.  No significant change from 09/21/2012.    Coronary calcium score 09/24/2020: LM 0 LAD 11.1 LCx 0 RCA 95.4. Total Agatston score 106.5, MESA database percentile: 94 Thoracic ascending and descending aorta are normal in size.  No other significant extracardiac abnormalities.  EKG:   EKG 11/26/2021: Normal sinus rhythm at rate of 68 bpm, normal axis.  No evidence of ischemia, normal EKG.  Compared to 09/03/2020, no significant change.  Assessment     ICD-10-CM   1. Hypercholesteremia  E78.00     2. Elevated coronary artery calcium score  R93.1 EKG 12-Lead    Semaglutide-Weight Management 1 MG/0.5ML SOAJ    Semaglutide-Weight Management 0.5 MG/0.5ML SOAJ    Semaglutide(0.25 or 0.'5MG'$ /DOS) SOPN 0.25 mg    DISCONTINUED: Semaglutide-Weight Management 0.5 MG/0.5ML SOAJ    DISCONTINUED: Semaglutide-Weight Management 1 MG/0.5ML SOAJ    3. Class 1 obesity due to excess calories without serious comorbidity with body mass index (BMI) of 34.0 to 34.9 in adult  E66.09 EKG 12-Lead   Z68.34 Semaglutide-Weight Management 1 MG/0.5ML SOAJ     Semaglutide-Weight Management 0.5 MG/0.5ML SOAJ    Semaglutide(0.25 or 0.'5MG'$ /DOS) SOPN 0.25 mg    DISCONTINUED: Semaglutide-Weight Management (WEGOVY) 0.25 MG/0.5ML SOAJ    DISCONTINUED: Semaglutide-Weight Management 0.5 MG/0.5ML SOAJ    DISCONTINUED: Semaglutide-Weight Management 1 MG/0.5ML SOAJ         Medications Discontinued During This Encounter  Medication Reason   Semaglutide-Weight Management (WEGOVY) 0.25 MG/0.5ML SOAJ    Semaglutide-Weight Management 0.5 MG/0.5ML SOAJ    Semaglutide-Weight Management 1 MG/0.5ML SOAJ     Meds ordered this encounter  Medications   DISCONTD: Semaglutide-Weight Management (WEGOVY) 0.25 MG/0.5ML SOAJ    Sig: Inject 0.25 mg into the skin every 7 (seven) days.    Dispense:  2 mL    Refill:  1   DISCONTD: Semaglutide-Weight Management 0.5 MG/0.5ML SOAJ    Sig: Inject 0.5 mg into the skin once a week for 28 days.    Dispense:  2 mL    Refill:  0   DISCONTD: Semaglutide-Weight Management 1 MG/0.5ML SOAJ    Sig: Inject 1 mg into the skin once a week.    Dispense:  2 mL    Refill:  1   Semaglutide-Weight Management 1 MG/0.5ML SOAJ    Sig: Inject 1 mg into the skin once a week.    Dispense:  2 mL  Refill:  1   Semaglutide-Weight Management 0.5 MG/0.5ML SOAJ    Sig: Inject 0.5 mg into the skin once a week for 28 days.    Dispense:  2 mL    Refill:  0   Semaglutide(0.25 or 0.'5MG'$ /DOS) SOPN 0.25 mg    We will switch to [facility]  once samples done. Rx for 0.5 mg sent    Orders Placed This Encounter  Procedures   EKG 12-Lead   Recommendations:   Stacie Nunez is a 56 y.o. Caucasian female who is fairly athletic, gets at least 20 to 25 miles of walking every week, a mile of swimming at least 2-3 times a week, does yoga and teaches yoga, asymptomatic without any chest pain, dyspnea or palpitation. In view of her markedly elevated coronary calcium score which is in the 90th percentile, I will start her on Crestor 20 mg daily but also add  Zetia and she now underwent lipid profile testing presents for follow-up.  She is at goal with greater than 50% reduction in LDL with LDL being close to 70.  Advised her to continue present medications.  She is concerned she is having myalgias just like she was with rosuvastatin but very mild especially in the hips.  Advised her to take a statin holiday on the weekends or on Tuesdays and Thursdays to see how she will do but would prefer her to continue high intensity statins along with Zetia.  Obesity continues to be a problem although she is muscular.  She would like to try [facility].  Extensive discussion regarding weight management, changes to the diet.  I have given her 1 dose of Ozempic and samples were given today right anterior abdominal subcu injection given and Wegovy orders were placed.  She will monitor her symptoms and let me know over the next 4 to 6 weeks how she is doing otherwise I will like to see her back in 3 months specifically for follow-up of obesity along with management of her risks for CAD.    Adrian Prows, MD, [facility] 11/26/2021, 10:31 PM Office: [phone]

## 2021-11-27 ENCOUNTER — Telehealth: Payer: Self-pay | Admitting: Cardiology

## 2021-11-27 NOTE — Telephone Encounter (Signed)
Please advise 

## 2021-11-27 NOTE — Telephone Encounter (Signed)
Patient states she was prescribed wegovy yesterday. However, her insurance is not accepting it. She does have a sample of ozempic, and is asking if it's possible to get a prescription for this instead of wegovy. Please call her at number provided.

## 2021-12-01 ENCOUNTER — Telehealth: Payer: Self-pay

## 2021-12-01 NOTE — Telephone Encounter (Signed)
Let her know. She can pay out of pocket with coupon . Talk to rep to find out cost

## 2021-12-01 NOTE — Telephone Encounter (Signed)
Wegovy was denied. The requested medication and/or diagnosis are not a covered benefit and are excluded from coverage

## 2021-12-03 ENCOUNTER — Other Ambulatory Visit: Payer: 59 | Admitting: Obstetrics & Gynecology

## 2021-12-03 ENCOUNTER — Other Ambulatory Visit: Payer: 59

## 2021-12-07 ENCOUNTER — Telehealth: Payer: Self-pay

## 2021-12-07 NOTE — Telephone Encounter (Signed)
Baxter Flattery and I spoke and since she was out of pocket pay, we did this as it would be cheaper, I am fine with either. Please talk to patient and also Baxter Flattery

## 2021-12-07 NOTE — Telephone Encounter (Signed)
Patient called and stated that you gave her a coupon for [facility] but you called in [facility] to her pharmacy. The pharmacy needs a new Rx for Ozempic sent in.

## 2021-12-09 ENCOUNTER — Other Ambulatory Visit: Payer: Self-pay

## 2021-12-09 MED ORDER — OZEMPIC (1 MG/DOSE) 4 MG/3ML ~~LOC~~ SOPN: 1.0000 mg | PEN_INJECTOR | SUBCUTANEOUS | 2 refills | Status: AC

## 2021-12-22 ENCOUNTER — Ambulatory Visit: Payer: 59 | Admitting: Physician Assistant

## 2022-02-11 ENCOUNTER — Encounter: Payer: Self-pay | Admitting: Obstetrics & Gynecology

## 2022-02-11 ENCOUNTER — Ambulatory Visit (INDEPENDENT_AMBULATORY_CARE_PROVIDER_SITE_OTHER): Payer: 59 | Admitting: Obstetrics & Gynecology

## 2022-02-11 ENCOUNTER — Ambulatory Visit (INDEPENDENT_AMBULATORY_CARE_PROVIDER_SITE_OTHER): Payer: 59

## 2022-02-11 VITALS — BP 120/78 | HR 78

## 2022-02-11 DIAGNOSIS — D219 Benign neoplasm of connective and other soft tissue, unspecified: Secondary | ICD-10-CM

## 2022-02-11 DIAGNOSIS — N926 Irregular menstruation, unspecified: Secondary | ICD-10-CM

## 2022-02-11 DIAGNOSIS — N950 Postmenopausal bleeding: Secondary | ICD-10-CM

## 2022-02-11 NOTE — Progress Notes (Signed)
    Stacie Nunez January 18, 1966 [id number]        57 y.o.  G2P0A2L0   RP: PMB/Fibroids for Pelvic US  HPI: Irregular episodes of vaginal spotting.  [place] 76.3 in 10/13/2021.  No PMB since 10/2021.  No pelvic pain.     OB History  Gravida Para Term Preterm AB Living  2 0 0 0 2 0  SAB IAB Ectopic Multiple Live Births               # Outcome Date GA Lbr Len/2nd Weight Sex Delivery Anes PTL Lv  2 AB           1 AB             Past medical history,surgical history, problem list, medications, allergies, family history and social history were all reviewed and documented in the EPIC chart.   Directed ROS with pertinent positives and negatives documented in the history of present illness/assessment and plan.  Exam:  There were no vitals filed for this visit. General appearance:  Normal  Pelvic US today: T/V images.  Enlarged irregular uterus with multiple subserosal and intramural fibroids as well as 1 submucosal.  The overall uterine size is measured at 9.13 x 7.67 x 5.16 cm.  6 fibroids are measured, the submucosal fibroid is measured at 3.65 cm, a fibroid on the left side of the uterus is measured at 4.38 cm and a third fibroid at the fundus is measured at 3.37 cm.  The other 3 fibroids are below 3 cm.  The endometrial cavity is greatly distorted by a central, probably submucosal fibroid, and therefore not evaluable.  Both ovaries are identified and are small with atrophic appearance.  The left ovary is only visible transabdominally.  No adnexal mass.  No free fluid in the pelvis.   Assessment/Plan:  57 y.o. G2P0020   1. Postmenopausal bleeding Irregular episodes of vaginal spotting.  [place] 76.3 in 10/13/2021.  No PMB since 10/2021.  No pelvic pain.  The pelvic ultrasound findings are thoroughly reviewed with the patient.  Given the submucosal fibroid, the endometrial lining is not evaluable.  Patient is postmenopausal based on [facility].  Will further investigate with a sonohysterogram with  endometrial biopsy at follow-up.  Patient voiced understanding and agreement with the plan. - [place] Sonohysterogram with EBx; Future  2. Fibroids Many uterine fibroids, including a submucosal fibroid measured at 3.65 cm. - [place] Sonohysterogram with EBx; Future   [name], 2:33 PM 02/11/2022

## 2022-02-21 ENCOUNTER — Other Ambulatory Visit: Payer: Self-pay | Admitting: Cardiology

## 2022-02-21 DIAGNOSIS — E7800 Pure hypercholesterolemia, unspecified: Secondary | ICD-10-CM

## 2022-02-21 DIAGNOSIS — R931 Abnormal findings on diagnostic imaging of heart and coronary circulation: Secondary | ICD-10-CM

## 2022-02-23 ENCOUNTER — Other Ambulatory Visit: Payer: Self-pay

## 2022-02-23 MED ORDER — EZETIMIBE 10 MG PO TABS: 10.0000 mg | ORAL_TABLET | Freq: Every day | ORAL | 3 refills | Status: AC

## 2022-03-10 ENCOUNTER — Telehealth: Payer: Self-pay

## 2022-03-10 NOTE — Telephone Encounter (Signed)
Patient is wanting to reduce cholesterol medications for about 3 months and come back in an reevaluate. She wants to stop taking her atorvastatin and is okay with continuing the ezetimibe. She learned that her cholesterol may be high due to not filtering her french pressed coffee, due to the excess oils in it. She is now filtering her french press coffee and wants to see if this was the issue, she feels confident this is the problem.  Please advise and I will call her back. Call back number: [phone]

## 2022-03-10 NOTE — Telephone Encounter (Signed)
No, that is not the reason for her cholesterol, Not to stop

## 2022-03-11 ENCOUNTER — Ambulatory Visit
Admission: RE | Admit: 2022-03-11 | Discharge: 2022-03-11 | Disposition: A | Discharge status: Home / Self Care | Payer: 59 | Source: Ambulatory Visit | Attending: Obstetrics & Gynecology | Admitting: Obstetrics & Gynecology

## 2022-03-11 DIAGNOSIS — N6320 Unspecified lump in the left breast, unspecified quadrant: Secondary | ICD-10-CM

## 2022-03-18 ENCOUNTER — Ambulatory Visit: Payer: 59 | Admitting: Cardiology

## 2022-03-18 NOTE — Telephone Encounter (Signed)
LMTCB

## 2022-03-31 NOTE — Telephone Encounter (Signed)
LMTCB

## 2022-04-08 ENCOUNTER — Other Ambulatory Visit: Payer: No Typology Code available for payment source | Admitting: Obstetrics & Gynecology

## 2022-04-08 ENCOUNTER — Other Ambulatory Visit: Payer: No Typology Code available for payment source

## 2022-05-06 ENCOUNTER — Telehealth: Payer: Self-pay

## 2022-05-06 DIAGNOSIS — E7800 Pure hypercholesterolemia, unspecified: Secondary | ICD-10-CM

## 2022-05-06 NOTE — Telephone Encounter (Signed)
Patient wants you to check her lipids before her upcoming appointment next week, is this ok?

## 2022-05-07 NOTE — Telephone Encounter (Signed)
Patient aware.

## 2022-05-12 LAB — LIPID PANEL WITH LDL/HDL RATIO
Cholesterol, Total: 122 mg/dL (ref 100–199)
HDL: 47 mg/dL (ref >39)
LDL Chol Calc (NIH): 59 mg/dL (ref 0–99)
LDL/HDL Ratio: 1.3 ratio (ref 0.0–3.2)
Triglycerides: 83 mg/dL (ref 0–149)
VLDL Cholesterol Cal: 16 mg/dL (ref 5–40)

## 2022-05-13 ENCOUNTER — Ambulatory Visit: Payer: Self-pay | Admitting: Cardiology

## 2022-05-27 ENCOUNTER — Other Ambulatory Visit: Payer: No Typology Code available for payment source

## 2022-05-27 ENCOUNTER — Other Ambulatory Visit: Payer: No Typology Code available for payment source | Admitting: Obstetrics & Gynecology

## 2022-06-09 IMAGING — CT CT CARDIAC CORONARY ARTERY CALCIUM SCORE
3 series · 14 of 20 positions shown, 16 images · non-contrast
Comparison: None.

CLINICAL DATA: 54-year-old Caucasian female with history
hyperlipidemia and family history of heart disease.

EXAM:
CT CARDIAC CORONARY ARTERY CALCIUM SCORE
TECHNIQUE: Non-contrast imaging through the heart was performed using
prospective ECG gating. Image post processing was performed on an
independent workstation, allowing for quantitative analysis of the
heart and coronary arteries. Note that this exam targets the heart
and the chest was not imaged in its entirety.

[Series 2: calcium scoring 2.00 qr36 bestdiast 68% hrt calciu · axial · 0.38mm/px · z∈[+1544,+1618]mm · 4 of 63 slices shown]
[im 13/63  vessel]
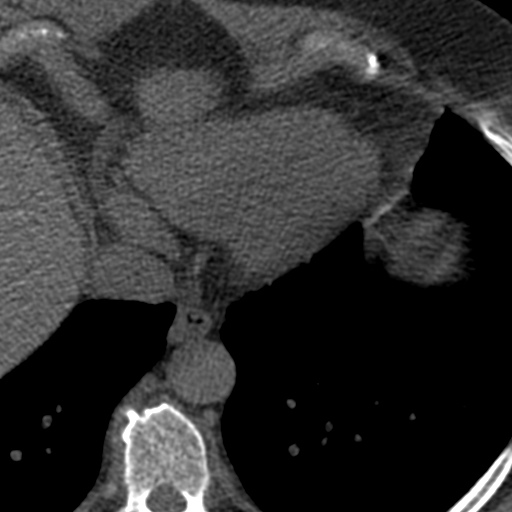
[im 25/63  vessel]
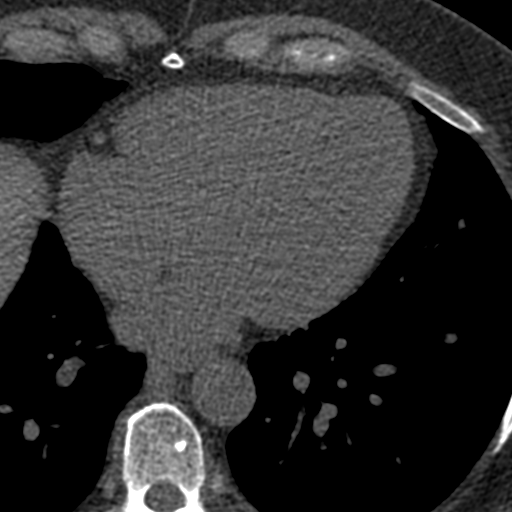
[im 38/63  vessel]
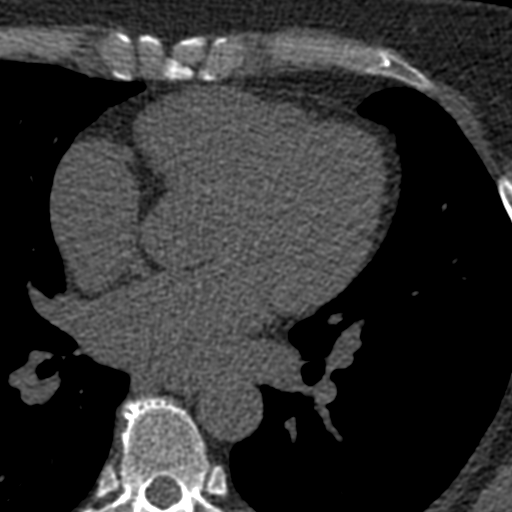
[im 50/63  vessel]
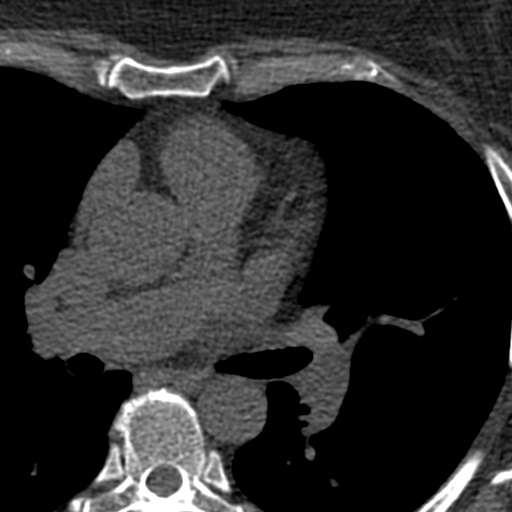

[Series 3: calcium scoring 2.00 br40 bestdiast 68% axial · axial · 0.57mm/px · z∈[+1540,+1622]mm · 5 of 63 slices shown, 7 images]
[im 11/63  vessel]
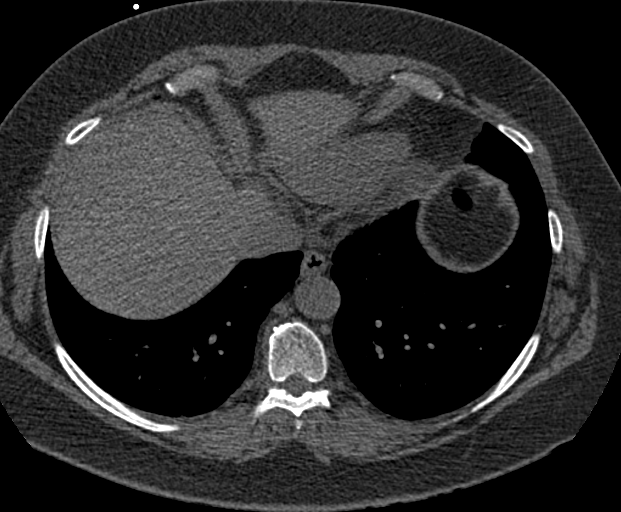
[im 11/63  lung]
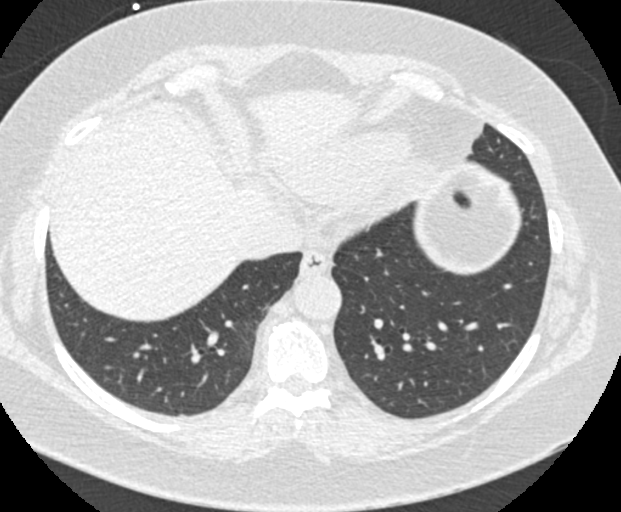
[im 21/63  vessel]
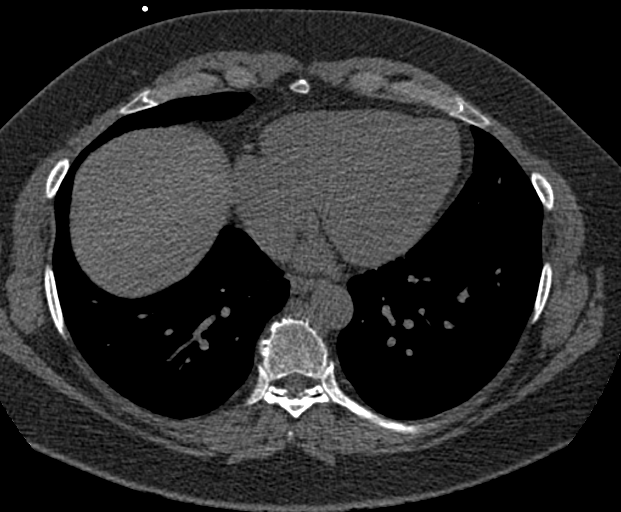
[im 32/63  vessel]
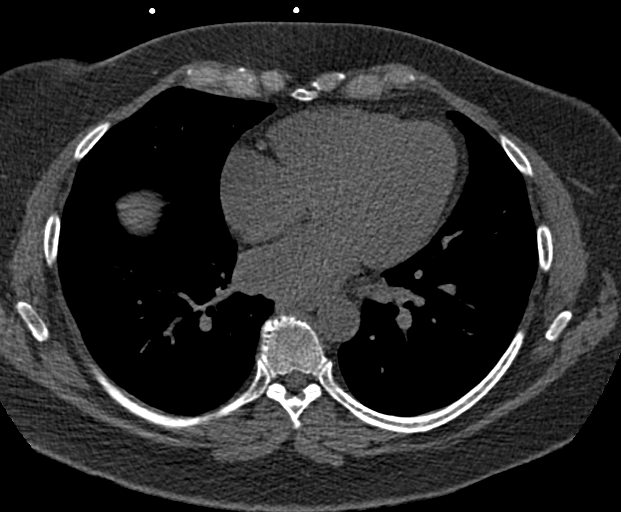
[im 42/63  vessel]
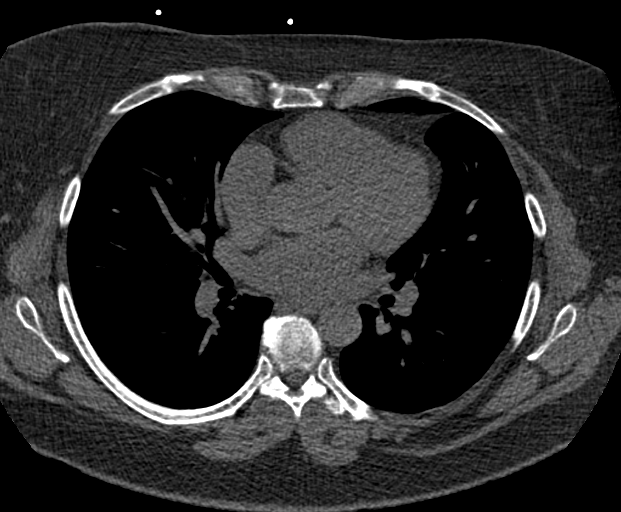
[im 52/63  vessel]
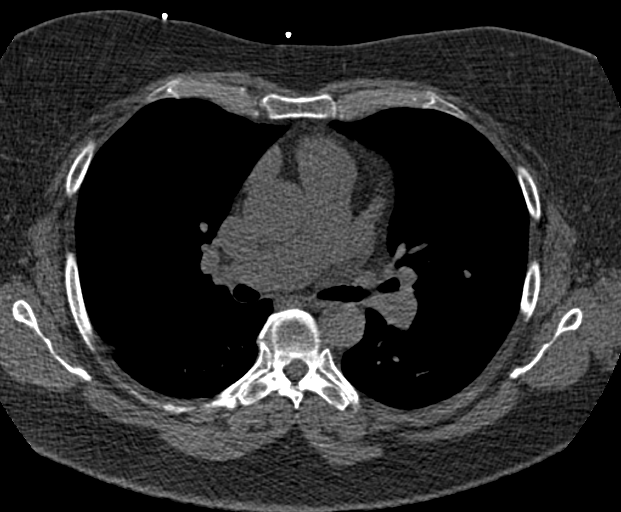
[im 52/63  lung]
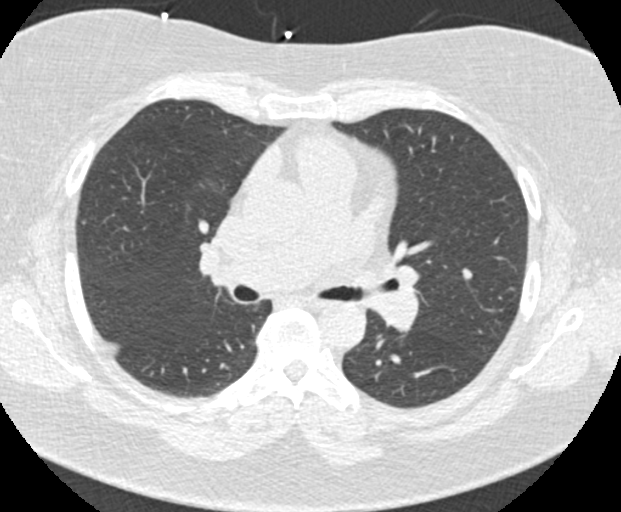

[Series 9: calcium scoring 2.00 br60 bestdiast 68% lungs · axial · 0.57mm/px · z∈[+1540,+1622]mm · 5 of 63 slices shown]
[im 11/63  vessel]
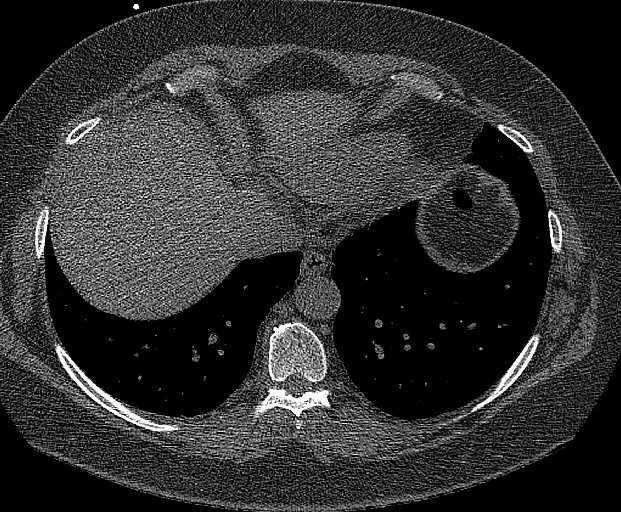
[im 21/63  vessel]
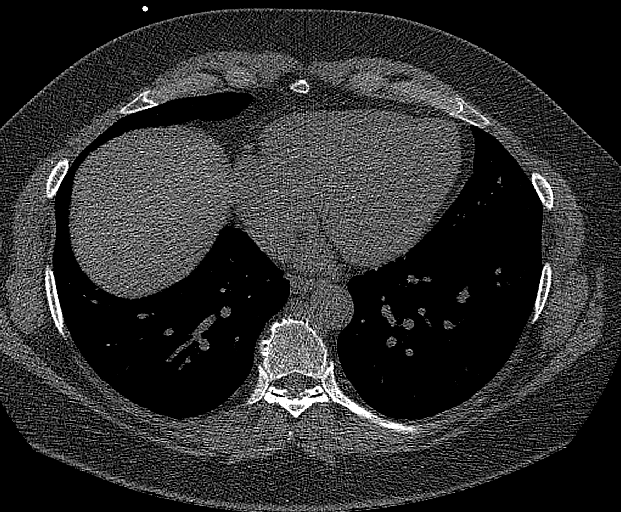
[im 32/63  vessel]
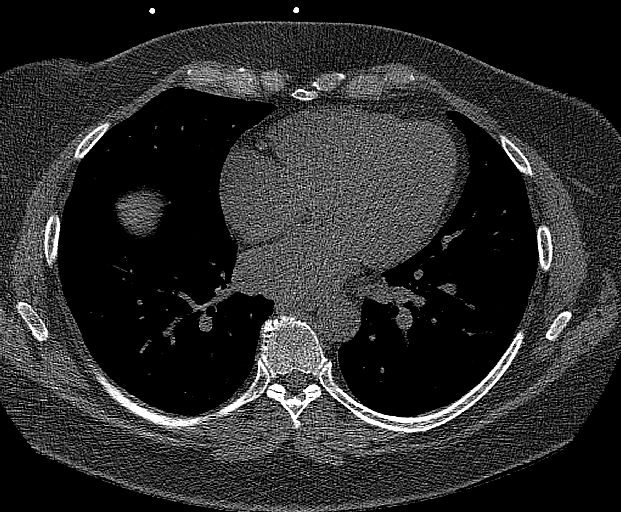
[im 42/63  vessel]
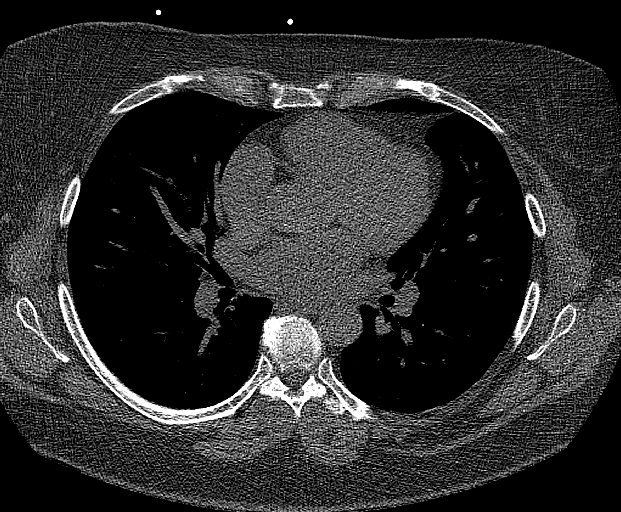
[im 52/63  vessel]
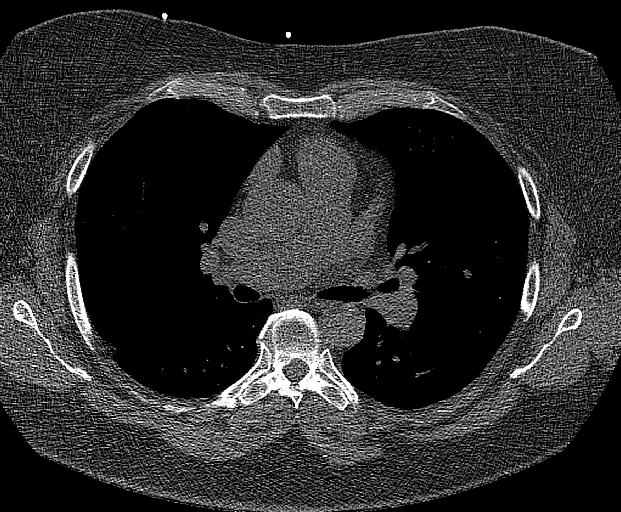

[14 of 20 positions shown; findings below may reference images not displayed]

FINDINGS: CORONARY CALCIUM SCORES:

Left Main: 0

LAD:

LCx: 0

RCA:

Total Agatston Score:

[HOSPITAL] percentile: 94

AORTA MEASUREMENTS:

Ascending Aorta: 33 mm

Descending Aorta: 26 mm

OTHER FINDINGS:

The heart size is within normal limits. No pericardial fluid is
identified. Visualized segments of the thoracic aorta and central
pulmonary arteries are normal in caliber. Visualized mediastinum and
hilar regions demonstrate no lymphadenopathy or masses. Visualized
lungs show no evidence of pulmonary edema, consolidation,
pneumothorax, nodule or pleural fluid. Visualized upper abdomen and
bony structures are unremarkable.
IMPRESSION: Coronary calcium score of 106.5 is at the 94th percentile for the
patient's age, sex and race.

## 2022-07-29 ENCOUNTER — Other Ambulatory Visit: Payer: Self-pay | Admitting: Obstetrics and Gynecology

## 2022-07-29 DIAGNOSIS — Z1231 Encounter for screening mammogram for malignant neoplasm of breast: Secondary | ICD-10-CM

## 2022-10-11 ENCOUNTER — Ambulatory Visit
Admission: RE | Admit: 2022-10-11 | Discharge: 2022-10-11 | Disposition: A | Discharge status: Home / Self Care | Payer: 59 | Source: Ambulatory Visit | Attending: Obstetrics and Gynecology | Admitting: Obstetrics and Gynecology

## 2022-10-11 DIAGNOSIS — Z1231 Encounter for screening mammogram for malignant neoplasm of breast: Secondary | ICD-10-CM

## 2022-10-14 ENCOUNTER — Ambulatory Visit: Payer: 59 | Admitting: Obstetrics & Gynecology

## 2022-11-03 ENCOUNTER — Other Ambulatory Visit (HOSPITAL_COMMUNITY): Payer: Self-pay

## 2022-11-04 ENCOUNTER — Other Ambulatory Visit (HOSPITAL_COMMUNITY): Payer: Self-pay

## 2022-11-04 MED ORDER — SEMAGLUTIDE-WEIGHT MANAGEMENT 0.5 MG/0.5ML ~~LOC~~ SOAJ
0.5000 mg | SUBCUTANEOUS | 5 refills | Status: AC
  Filled 2022-11-04: qty 2, 28d supply, fill #0

## 2022-11-05 ENCOUNTER — Other Ambulatory Visit (HOSPITAL_COMMUNITY): Payer: Self-pay

## 2022-11-16 ENCOUNTER — Other Ambulatory Visit (HOSPITAL_COMMUNITY): Payer: Self-pay

## 2022-11-18 ENCOUNTER — Other Ambulatory Visit: Payer: Self-pay

## 2023-04-19 ENCOUNTER — Inpatient Hospital Stay: Admitting: Licensed Clinical Social Worker

## 2023-04-19 DIAGNOSIS — I10 Essential (primary) hypertension: Secondary | ICD-10-CM

## 2023-04-19 NOTE — Progress Notes (Signed)
 [facility] Healthcare Advance Directives Clinical Social Work  Patient presented to Advance Directives Clinic  to review and complete healthcare advance directives.  Clinical Social Worker met with patient and spouse.  The patient designated Stacie Nunez (husband [phone]) as their primary healthcare agent and Stacie Nunez [phone]) as their secondary agent.  Patient also completed healthcare living will.    Documents were notarized and copies made for patient/family. Clinical Social Worker will send documents to medical records to be scanned into patient's chart. Clinical Social Worker encouraged patient/family to contact with any additional questions or concerns.   Rachel Moulds, LCSW Clinical Social Worker [facility]

## 2023-11-23 ENCOUNTER — Other Ambulatory Visit: Payer: Self-pay | Admitting: Obstetrics and Gynecology

## 2023-11-23 DIAGNOSIS — Z1231 Encounter for screening mammogram for malignant neoplasm of breast: Secondary | ICD-10-CM

## 2023-12-14 ENCOUNTER — Encounter: Payer: Self-pay | Admitting: Radiology

## 2023-12-14 ENCOUNTER — Ambulatory Visit
Admission: RE | Admit: 2023-12-14 | Discharge: 2023-12-14 | Disposition: A | Discharge status: Home / Self Care | Source: Ambulatory Visit | Attending: Obstetrics and Gynecology | Admitting: Obstetrics and Gynecology

## 2023-12-14 DIAGNOSIS — Z1231 Encounter for screening mammogram for malignant neoplasm of breast: Secondary | ICD-10-CM

## 2023-12-20 ENCOUNTER — Other Ambulatory Visit: Payer: Self-pay | Admitting: Obstetrics and Gynecology

## 2023-12-20 DIAGNOSIS — E7800 Pure hypercholesterolemia, unspecified: Secondary | ICD-10-CM

## 2024-01-10 ENCOUNTER — Other Ambulatory Visit

## 2024-01-17 ENCOUNTER — Other Ambulatory Visit: Payer: Self-pay

## 2024-02-21 ENCOUNTER — Ambulatory Visit
Admission: RE | Admit: 2024-02-21 | Discharge: 2024-02-21 | Disposition: A | Source: Ambulatory Visit | Attending: Obstetrics and Gynecology | Admitting: Obstetrics and Gynecology

## 2024-02-21 DIAGNOSIS — E7800 Pure hypercholesterolemia, unspecified: Secondary | ICD-10-CM

## 2024-02-22 ENCOUNTER — Telehealth: Payer: Self-pay | Admitting: Cardiology

## 2024-02-22 DIAGNOSIS — Z5181 Encounter for therapeutic drug level monitoring: Secondary | ICD-10-CM

## 2024-02-22 DIAGNOSIS — E7800 Pure hypercholesterolemia, unspecified: Secondary | ICD-10-CM

## 2024-02-22 NOTE — Telephone Encounter (Signed)
 Pt called in stating to make an appt, she is scheduled 03/26/24. She asked if she can have labs drawn prior to. Please advise

## 2024-02-23 NOTE — Telephone Encounter (Signed)
 If she has not seen any of the primary care physicians, I just need lipid profile testing and LFTs for hypercholesterolemia and therapeutic drug monitoring.

## 2024-03-01 NOTE — Addendum Note (Signed)
 Addended by: DORINA BASE on: 03/01/2024 08:35 AM   Modules accepted: Orders

## 2024-03-01 NOTE — Telephone Encounter (Signed)
 Labs have been ordered. I tried to contact patient, NA, LMAM.

## 2024-03-02 NOTE — Telephone Encounter (Signed)
 Called patient again, I left a message telling patient that labs have been ordered and she can go anytime to [facility] and have them drawn.

## 2024-03-26 ENCOUNTER — Ambulatory Visit: Payer: Self-pay | Admitting: Cardiology
